# Patient Record
Sex: Male | Born: 1955 | Race: Black or African American | Hispanic: No | Marital: Single | Smoking: Never smoker
Health system: Southern US, Community
[De-identification: ages and names within clinical notes are randomized; demographics above are authoritative.]

## PROBLEM LIST (undated history)

## (undated) DIAGNOSIS — R040 Epistaxis: Secondary | ICD-10-CM

## (undated) DIAGNOSIS — I2510 Atherosclerotic heart disease of native coronary artery without angina pectoris: Secondary | ICD-10-CM

## (undated) DIAGNOSIS — G20A1 Parkinson's disease without dyskinesia, without mention of fluctuations: Secondary | ICD-10-CM

## (undated) DIAGNOSIS — I10 Essential (primary) hypertension: Secondary | ICD-10-CM

## (undated) DIAGNOSIS — G20 Parkinson's disease: Secondary | ICD-10-CM

---

## 2004-12-10 ENCOUNTER — Emergency Department (HOSPITAL_COMMUNITY): Admission: EM | Admit: 2004-12-10 | Discharge: 2004-12-10 | Payer: Self-pay | Admitting: Emergency Medicine

## 2013-09-14 ENCOUNTER — Encounter (HOSPITAL_COMMUNITY): Payer: Self-pay | Admitting: Emergency Medicine

## 2013-09-14 ENCOUNTER — Emergency Department (HOSPITAL_COMMUNITY)
Admission: EM | Admit: 2013-09-14 | Discharge: 2013-09-15 | Disposition: A | Discharge status: Home / Self Care | Payer: Self-pay | Attending: Emergency Medicine | Admitting: Emergency Medicine

## 2013-09-14 DIAGNOSIS — I10 Essential (primary) hypertension: Secondary | ICD-10-CM | POA: Insufficient documentation

## 2013-09-14 DIAGNOSIS — R040 Epistaxis: Secondary | ICD-10-CM | POA: Insufficient documentation

## 2013-09-14 HISTORY — DX: Essential (primary) hypertension: I10

## 2013-09-14 MED ORDER — OXYMETAZOLINE HCL 0.05 % NA SOLN
1.0000 | Freq: Once | NASAL | Status: AC
  Administered 2013-09-14: 1 via NASAL
  Filled 2013-09-14: qty 15

## 2013-09-14 NOTE — ED Notes (Signed)
Patient presents with nose bleed for the last several hours  States he has HTN but does not take BP meds

## 2013-09-14 NOTE — ED Notes (Signed)
Discharge instructions given  Voiced understanding.   

## 2013-09-14 NOTE — ED Provider Notes (Signed)
CSN: [id number]     Arrival date & time 09/14/13  1855 History   First MD Initiated Contact with Patient 09/14/13 1903     Chief Complaint  Patient presents with  . Epistaxis     (Consider location/radiation/quality/duration/timing/severity/associated sxs/prior Treatment) HPI  This is a 58 y.o. male, with a past medical history of hypertension, who has not seen a doctor in "over 20 years," with a history of frequent epistaxis, presenting today with epistaxis. Onset 4 hours ago. Bleeding is from the left naris. It is persistent. It is plentiful. Patient has applied a pressure to no avail. No aggravating factors. Negative for pain or trauma. Negative for family history of bleeding disorders. Patient denies bleeding from any other places abnormally.  Past Medical History  Diagnosis Date  . Hypertension    History reviewed. No pertinent past surgical history. History reviewed. No pertinent family history. History  Substance Use Topics  . Smoking status: Never Smoker   . Smokeless tobacco: Never Used  . Alcohol Use: No    Review of Systems  Constitutional: Negative for fever and chills.  HENT: Positive for nosebleeds. Negative for facial swelling.   Eyes: Negative for pain and visual disturbance.  Respiratory: Negative for chest tightness and shortness of breath.   Cardiovascular: Negative for chest pain.  Gastrointestinal: Negative for nausea and vomiting.  Genitourinary: Negative for dysuria.  Musculoskeletal: Negative for arthralgias and myalgias.  Neurological: Negative for headaches.  Psychiatric/Behavioral: Negative for behavioral problems.      Allergies  Review of patient's allergies indicates no known allergies.  Home Medications   Prior to Admission medications   Not on File   BP 163/100  Pulse 91  Temp(Src) 98.4 F (36.9 C) (Oral)  Resp 16  Ht 5\' 8"  (1.727 m)  Wt 243 lb (110.224 kg)  BMI 36.96 kg/m2  SpO2 99% Physical Exam  Constitutional: He is  oriented to person, place, and time. He appears well-developed and well-nourished. No distress.  HENT:  Head: Normocephalic and atraumatic.  Nose: Epistaxis (left-sided) is observed.  Mouth/Throat: No oropharyngeal exudate.  Eyes: Conjunctivae are normal. Pupils are equal, round, and reactive to light. No scleral icterus.  Neck: Normal range of motion. No tracheal deviation present. No thyromegaly present.  Cardiovascular: Normal rate, regular rhythm and normal heart sounds.  Exam reveals no gallop and no friction rub.   No murmur heard. Pulmonary/Chest: Effort normal and breath sounds normal. No stridor. No respiratory distress. He has no wheezes. He has no rales. He exhibits no tenderness.  Abdominal: Soft. He exhibits no distension and no mass. There is no tenderness. There is no rebound and no guarding.  Musculoskeletal: Normal range of motion. He exhibits no edema.  Neurological: He is alert and oriented to person, place, and time.  Skin: Skin is warm and dry. He is not diaphoretic.    ED Course  Procedures (including critical care time)  MDM   Final diagnoses:  Epistaxis    This is a 58 y.o. male, with a past medical history of hypertension, who has not seen a doctor in "over 20 years," with a history of frequent epistaxis, presenting today with epistaxis. Onset 4 hours ago. Bleeding is from the left naris. It is persistent. It is plentiful. Patient has applied a pressure to no avail. No aggravating factors. Negative for pain or trauma. Negative for family history of bleeding disorders. Patient denies bleeding from any other places abnormally.  I was unable to localize exact source of bleed,  although I am confident it is from the left naris. Appears anterior in location.  Patient has no signs or symptoms of anemia. I've had patient blow his nose, I have irrigated the left nose thoroughly, evacuated all clots, applied liberal amount of Afrin, and placed a 5.5 rapid Rhino into the left  naris. The patient's nose is currently hemostatic. We'll continue to monitor closely. Patient remains stable at this time.  Patient remained stable and hemostatic at this time. I am discharging the patient with rapid Rhino in place, with instructions to followup with ENT office in the next 3-5 days.  Unfortunately, patient has begun bleeding once more while walking out of the department. I reevaluated the patient. The bleeding is a very slow drip from the left naris. I have discussed the various options with the patient, including risks and benefits. These options include inflating the rapid Rhino by very small amount, watchful waiting, removing the rapid rhino and replacing with a larger rapid Rhino.  Patient is very clear that he does not desire the removal of this rapid rhino with replacement of a larger one.  With the patient's permission, I have added 2 cc of air to the rapid Rhino. His nose is once again hemostatic.  He is requesting discharge. Pt stable for discharge, FU.  All questions answered.  Return precautions given.  I have discussed case and care has been guided by my attending physician, Dr. Fonnie JarvisBednar.   Loma BostonStirling Jaimere Feutz, MD 09/14/13 [phone]

## 2013-09-14 NOTE — ED Notes (Signed)
Called for triage no answer  

## 2013-09-14 NOTE — Discharge Instructions (Signed)

## 2013-09-14 NOTE — ED Notes (Signed)
Dr Clearance CootsHarper in to inflate rapid rhino.  Very little bleeding noted from left nostril.  Patient moved to hall in chair to receive another patient.

## 2013-09-14 NOTE — ED Notes (Signed)
Patient was leaving the ED and at discharge and stated that his nose stated bleeding again.  Patient returned to Room 34

## 2013-09-15 NOTE — ED Notes (Signed)
Patient reviewed discharge instructions, voiced understanding.

## 2013-09-19 NOTE — ED Provider Notes (Signed)
I saw and evaluated the patient, reviewed the resident's note and I agree with the findings and plan.   EKG Interpretation None     Bleeding controlled with RapidRhino.  Hurman HornJohn M Broxton Broady, MD 09/19/13 2136

## 2013-09-26 ENCOUNTER — Observation Stay (HOSPITAL_COMMUNITY): Payer: Self-pay

## 2013-09-26 ENCOUNTER — Observation Stay (HOSPITAL_COMMUNITY)
Admission: EM | Admit: 2013-09-26 | Discharge: 2013-09-27 | Disposition: A | Discharge status: Home / Self Care | Payer: Self-pay | Attending: Internal Medicine | Admitting: Internal Medicine

## 2013-09-26 ENCOUNTER — Encounter (HOSPITAL_COMMUNITY): Payer: Self-pay | Admitting: Emergency Medicine

## 2013-09-26 DIAGNOSIS — I10 Essential (primary) hypertension: Secondary | ICD-10-CM | POA: Insufficient documentation

## 2013-09-26 DIAGNOSIS — R040 Epistaxis: Principal | ICD-10-CM | POA: Diagnosis present

## 2013-09-26 DIAGNOSIS — R9431 Abnormal electrocardiogram [ECG] [EKG]: Secondary | ICD-10-CM | POA: Insufficient documentation

## 2013-09-26 DIAGNOSIS — E876 Hypokalemia: Secondary | ICD-10-CM | POA: Insufficient documentation

## 2013-09-26 DIAGNOSIS — R55 Syncope and collapse: Secondary | ICD-10-CM

## 2013-09-26 LAB — COMPREHENSIVE METABOLIC PANEL
ALT: 17 U/L (ref 0–53)
AST: 23 U/L (ref 0–37)
Albumin: 3.5 g/dL (ref 3.5–5.2)
Alkaline Phosphatase: 75 U/L (ref 39–117)
BUN: 15 mg/dL (ref 6–23)
CHLORIDE: 102 meq/L (ref 96–112)
CO2: 23 meq/L (ref 19–32)
Calcium: 8.5 mg/dL (ref 8.4–10.5)
Creatinine, Ser: 1.33 mg/dL (ref 0.50–1.35)
GFR calc Af Amer: 68 mL/min — ABNORMAL LOW (ref >90)
GFR, EST NON AFRICAN AMERICAN: 58 mL/min — AB (ref >90)
GLUCOSE: 173 mg/dL — AB (ref 70–99)
Potassium: 3.4 mEq/L — ABNORMAL LOW (ref 3.7–5.3)
SODIUM: 142 meq/L (ref 137–147)
TOTAL PROTEIN: 7.3 g/dL (ref 6.0–8.3)

## 2013-09-26 LAB — CBG MONITORING, ED: Glucose-Capillary: 134 mg/dL — ABNORMAL HIGH (ref 70–99)

## 2013-09-26 LAB — CBC WITH DIFFERENTIAL/PLATELET
BASOS ABS: 0 10*3/uL (ref 0.0–0.1)
BASOS PCT: 1 % (ref 0–1)
Eosinophils Absolute: 0.2 10*3/uL (ref 0.0–0.7)
Eosinophils Relative: 3 % (ref 0–5)
HCT: 33.3 % — ABNORMAL LOW (ref 39.0–52.0)
Hemoglobin: 11.3 g/dL — ABNORMAL LOW (ref 13.0–17.0)
LYMPHS ABS: 3.8 10*3/uL (ref 0.7–4.0)
LYMPHS PCT: 47 % — AB (ref 12–46)
MCH: 30.1 pg (ref 26.0–34.0)
MCHC: 33.9 g/dL (ref 30.0–36.0)
MCV: 88.8 fL (ref 78.0–100.0)
MONOS PCT: 8 % (ref 3–12)
Monocytes Absolute: 0.7 10*3/uL (ref 0.1–1.0)
NEUTROS PCT: 41 % — AB (ref 43–77)
Neutro Abs: 3.3 10*3/uL (ref 1.7–7.7)
PLATELETS: 302 10*3/uL (ref 150–400)
RBC: 3.75 MIL/uL — AB (ref 4.22–5.81)
RDW: 13.7 % (ref 11.5–15.5)
WBC: 8 10*3/uL (ref 4.0–10.5)

## 2013-09-26 LAB — PROTIME-INR
INR: 0.93 (ref 0.00–1.49)
PROTHROMBIN TIME: 12.3 s (ref 11.6–15.2)

## 2013-09-26 LAB — TROPONIN I
Troponin I: <0.3 ng/mL (ref <0.30)
Troponin I: <0.3 ng/mL (ref <0.30)
Troponin I: <0.3 ng/mL (ref <0.30)

## 2013-09-26 LAB — LIPID PANEL
CHOLESTEROL: 129 mg/dL (ref 0–200)
HDL: 47 mg/dL (ref >39)
LDL CALC: 73 mg/dL (ref 0–99)
TRIGLYCERIDES: 44 mg/dL (ref <150)
Total CHOL/HDL Ratio: 2.7 RATIO
VLDL: 9 mg/dL (ref 0–40)

## 2013-09-26 LAB — HEMOGLOBIN AND HEMATOCRIT, BLOOD
HCT: 31.4 % — ABNORMAL LOW (ref 39.0–52.0)
Hemoglobin: 10.6 g/dL — ABNORMAL LOW (ref 13.0–17.0)

## 2013-09-26 LAB — SAMPLE TO BLOOD BANK

## 2013-09-26 LAB — HEMOGLOBIN A1C
HEMOGLOBIN A1C: 5.7 % — AB (ref <5.7)
Mean Plasma Glucose: 117 mg/dL — ABNORMAL HIGH (ref <117)

## 2013-09-26 MED ORDER — CEPHALEXIN 250 MG/5ML PO SUSR: 250.0000 mg | Freq: Three times a day (TID) | ORAL | Status: DC

## 2013-09-26 MED ORDER — CEPHALEXIN 250 MG PO CAPS
250.0000 mg | ORAL_CAPSULE | Freq: Three times a day (TID) | ORAL | Status: DC
  Administered 2013-09-26 – 2013-09-27 (×4): 250 mg via ORAL
  Filled 2013-09-26 (×7): qty 1

## 2013-09-26 MED ORDER — HYDROCODONE-ACETAMINOPHEN 5-325 MG PO TABS
1.0000 | ORAL_TABLET | ORAL | Status: DC | PRN
  Administered 2013-09-26: 2 via ORAL
  Filled 2013-09-26: qty 2

## 2013-09-26 MED ORDER — ONDANSETRON HCL 4 MG/2ML IJ SOLN: 4.0000 mg | Freq: Four times a day (QID) | INTRAMUSCULAR | Status: DC | PRN

## 2013-09-26 MED ORDER — ONDANSETRON HCL 4 MG/2ML IJ SOLN
4.0000 mg | Freq: Once | INTRAMUSCULAR | Status: AC
  Administered 2013-09-26: 4 mg via INTRAVENOUS
  Filled 2013-09-26: qty 2

## 2013-09-26 MED ORDER — SODIUM CHLORIDE 0.9 % IV BOLUS (SEPSIS)
1000.0000 mL | Freq: Once | INTRAVENOUS | Status: AC
  Administered 2013-09-26: 1000 mL via INTRAVENOUS

## 2013-09-26 MED ORDER — ACETAMINOPHEN 325 MG PO TABS
650.0000 mg | ORAL_TABLET | Freq: Four times a day (QID) | ORAL | Status: DC | PRN
  Filled 2013-09-26: qty 2

## 2013-09-26 MED ORDER — SODIUM CHLORIDE 0.9 % IV SOLN
INTRAVENOUS | Status: DC
  Administered 2013-09-26: 23:00:00 via INTRAVENOUS

## 2013-09-26 MED ORDER — HYDRALAZINE HCL 20 MG/ML IJ SOLN
10.0000 mg | Freq: Four times a day (QID) | INTRAMUSCULAR | Status: DC | PRN
  Administered 2013-09-26 – 2013-09-27 (×2): 10 mg via INTRAVENOUS
  Filled 2013-09-26 (×2): qty 1

## 2013-09-26 MED ORDER — SODIUM CHLORIDE 0.9 % IV SOLN
INTRAVENOUS | Status: AC
  Administered 2013-09-26: 12:00:00 via INTRAVENOUS

## 2013-09-26 MED ORDER — DEXTROSE 50 % IV SOLN
INTRAVENOUS | Status: AC
  Filled 2013-09-26: qty 50

## 2013-09-26 MED ORDER — ONDANSETRON HCL 4 MG PO TABS: 4.0000 mg | ORAL_TABLET | Freq: Four times a day (QID) | ORAL | Status: DC | PRN

## 2013-09-26 MED ORDER — POTASSIUM CHLORIDE CRYS ER 20 MEQ PO TBCR
40.0000 meq | EXTENDED_RELEASE_TABLET | Freq: Once | ORAL | Status: AC
  Administered 2013-09-26: 40 meq via ORAL
  Filled 2013-09-26: qty 2

## 2013-09-26 MED ORDER — ACETAMINOPHEN 650 MG RE SUPP: 650.0000 mg | Freq: Four times a day (QID) | RECTAL | Status: DC | PRN

## 2013-09-26 NOTE — H&P (Signed)
[facility] History and Physical  Jason DeutscherWilliam Minasyan ZOX:[id number] DOB: [birth year] DOA: 09/26/2013  Referring physician: Dr Manus Gunningancour.  PCP: No PCP Per Patient   Chief Complaint: nose bleeding.   HPI: Jason Roberson is a 58 y.o. male with no significant PMH who presents with nose bleed that started today around 6 Am. He had history of similar presentation on April 18 that was controlled a [employer] R[employer]. He did not followup with ENT. He said the packing fell out 3 days after he was in the ED. He relates profuse nose bleeding that started this am. Patient vomited twice bright red blood.   While patient was been cauterized with silver nitrate,  patient became extremely diaphoretic, minimally responsive. Mental status improved with observation in the ED. No hypotension. EKG shows ST elevation in aVR with diffuse ST depression in inferior lateral leads. Dr Manus Gunningancour discussed with Dr. Excell Seltzerooper cardiology. He feels is not meet STEMI criteria. Recommends enzymes and repeat EKG.  Subsequent EKGs showed improving elevation in aVR. Evidence of LVH. First troponin negative. Patient denies any chest pain or shortness of breath. [facility]  was consulted for admission.     Review of Systems:  Negative except as per HPI.    Past Medical History  Diagnosis Date  . Hypertension    History reviewed. No pertinent past surgical history. Social History:  reports that he has never smoked. He has never used smokeless tobacco. He reports that he does not drink alcohol or use illicit drugs.  No Known Allergies  Family History; father with history of HTN.   Prior to Admission medications   Not on File  No medications prior to admission other than baby aspirin.   Physical Exam: Filed Vitals:   09/26/13 1235  BP: 145/90  Pulse:   Temp:   Resp: 17    BP 145/90  Pulse 95  Temp(Src) 98.8 F (37.1 C) (Oral)  Resp 17  Wt 110.224 kg (243 lb)  SpO2 99%  General:  Appears calm and comfortable Eyes: PERRL,  normal lids, irises & conjunctiva ENT: grossly normal hearing, lips & tongue, rhino -rocket in place.  Neck: no LAD, masses or thyromegaly Cardiovascular: RRR, no m/r/g. No LE edema. Telemetry: SR, no arrhythmias  Respiratory: CTA bilaterally, no w/r/r. Normal respiratory effort. Abdomen: soft, ntnd Skin: no rash or induration seen on limited exam Musculoskeletal: grossly normal tone BUE/BLE Psychiatric: grossly normal mood and affect, speech fluent and appropriate Neurologic: grossly non-focal.          Labs on Admission:  Basic Metabolic Panel:  Recent Labs Lab 09/26/13 1001  NA 142  K 3.4*  CL 102  CO2 23  GLUCOSE 173*  BUN 15  CREATININE 1.33  CALCIUM 8.5   Liver Function Tests:  Recent Labs Lab 09/26/13 1001  AST 23  ALT 17  ALKPHOS 75  BILITOT <0.2*  PROT 7.3  ALBUMIN 3.5   No results found for this basename: LIPASE, AMYLASE,  in the last 168 hours No results found for this basename: AMMONIA,  in the last 168 hours CBC:  Recent Labs Lab 09/26/13 1001  WBC 8.0  NEUTROABS 3.3  HGB 11.3*  HCT 33.3*  MCV 88.8  PLT 302   Cardiac Enzymes:  Recent Labs Lab 09/26/13 1001  TROPONINI <0.30    BNP (last 3 results) No results found for this basename: PROBNP,  in the last 8760 hours CBG:  Recent Labs Lab 09/26/13 0954  GLUCAP 134*    Radiological Exams on Admission:  Dg Chest Portable 1 View  09/26/2013   CLINICAL DATA:  Severe epistaxis  EXAM: PORTABLE CHEST - 1 VIEW  COMPARISON:  None.  FINDINGS: The heart size and mediastinal contours are within normal limits. Both lungs are clear. The visualized skeletal structures are unremarkable.  IMPRESSION: No active disease.   Electronically Signed   By: Elige KoHetal  Patel   On: 09/26/2013 12:54    EKG: Independently reviewed. First EKG with ST elevation on AVR subsequently EKG with no significant elevation on AVR.   Assessment/Plan Active Problems:   Epistaxis   Syncope   1-Epistaxis; patient s/p  cauterization  with silver nitrate and Rhino Rocket was placed. Bleeding stop. Will start prophylactic antibiotics. Needs to follow up with ENT.   2-Syncope; probably vaso- vagal. Cycle cardiac enzymes, monitor on telemetry. Repeat Hb. Check ECHO.   3-Abnormal EKG; cycle troponin, check ECHO. Patient denies chest pain or dyspnea. Will risk stratified.   4-Hypokalemia; replete with 40 meq times one.   Code Status: Full Code.  Family Communication: care discussed with patient.  Disposition Plan: expect less than 2 days admission.   Time spent: 75 minutes.   Belkys A Regalado [facility] Pager [phone]

## 2013-09-26 NOTE — ED Notes (Signed)
Pt remains diaphoretic, alert, aware of what is going on, following directions. Dr. Manus Gunningancour at bedside

## 2013-09-26 NOTE — ED Notes (Signed)
Lunch tray given. 

## 2013-09-26 NOTE — Discharge Planning (Signed)
[facility] Community Liaison  Spoke to patient about follow up care and primary care resources. Patient states he has not seen a provider in over 30 years. I provided the patient with the orange card application and instructions, patient is being admitted so a follow up appointment was not made. Patient was instructed to contact me if he was not set up with a provider upon discharge. Resource guide and my contact information was provided.

## 2013-09-26 NOTE — ED Notes (Signed)
Report given to [id number] RN  

## 2013-09-26 NOTE — ED Notes (Signed)
To ed via private vehicle with epistaxis- from left nares, started bleeding at 5 am, hx of same. Bleeding bright red blood, large amount-- alert/oriented x 3, w/d

## 2013-09-26 NOTE — Progress Notes (Signed)
09/26/13 Patient's BP 186/111,pt denies any c/o.NP T. Claiborne BillingsCallahan notified,new orders received.Will continue to monitor. Valerya Maxton Heide ScalesJoselita Isaish Alemu, RN

## 2013-09-26 NOTE — ED Provider Notes (Signed)
CSN: [id number]     Arrival date & time 09/26/13  [id number] History   First MD Initiated Contact with Patient 09/26/13 [phone]     Chief Complaint  Patient presents with  . Epistaxis     (Consider location/radiation/quality/duration/timing/severity/associated sxs/prior Treatment) HPI Comments: Patient complains of ongoing left-sided nosebleed since 6 AM. History of similar presentation on April 18 that was controlled a [employer] R[employer]. Patient is not on any blood thinners. He tried pressure without relief. He did not followup with ENT. He said the packing fell out 3 days after he was in the ED. It occurred again this morning. No history of bleeding disorders. No chest pain, shortness of breath or abdominal pain.  The history is provided by the patient.    Past Medical History  Diagnosis Date  . Hypertension    History reviewed. No pertinent past surgical history. History reviewed. No pertinent family history. History  Substance Use Topics  . Smoking status: Never Smoker   . Smokeless tobacco: Never Used  . Alcohol Use: No    Review of Systems  Unable to perform ROS: Acuity of condition  Constitutional: Positive for appetite change. Negative for activity change.      Allergies  Review of patient's allergies indicates no known allergies.  Home Medications   Prior to Admission medications   Not on File   BP 132/81  Pulse 95  Temp(Src) 98.8 F (37.1 C) (Oral)  Resp 20  Wt 243 lb (110.224 kg)  SpO2 93% Physical Exam  Constitutional: He is oriented to person, place, and time. He appears well-developed and well-nourished. He appears distressed.  Patient distress with 2 emesis basins full of blood.  HENT:  Head: Normocephalic and atraumatic.  Mouth/Throat: Oropharynx is clear and moist. No oropharyngeal exudate.  Brisk arterial bleeding from the left nasal septum Oropharynx is clear  Eyes: Conjunctivae and EOM are normal. Pupils are equal, round, and reactive to light.  Neck:  Normal range of motion. Neck supple.  Cardiovascular: Normal rate, regular rhythm and normal heart sounds.   Pulmonary/Chest: Effort normal and breath sounds normal. No respiratory distress.  Abdominal: Soft. There is no tenderness. There is no rebound.  Musculoskeletal: Normal range of motion. He exhibits no edema and no tenderness.  Neurological: He is alert and oriented to person, place, and time. No cranial nerve deficit. He exhibits normal muscle tone. Coordination normal.  Skin: Skin is warm. He is diaphoretic.    ED Course  EPISTAXIS MANAGEMENT Date/Time: 09/26/2013 12:21 PM Performed by: Glynn OctaveANCOUR, Eloise Mula Authorized by: Glynn OctaveANCOUR, Tahiry Spicer Consent: Verbal consent obtained. The procedure was performed in an emergent situation. Risks and benefits: risks, benefits and alternatives were discussed Consent given by: patient Patient understanding: patient states understanding of the procedure being performed Patient consent: the patient's understanding of the procedure matches consent given Procedure consent: procedure consent matches procedure scheduled Patient identity confirmed: verbally with patient and provided demographic data Time out: Immediately prior to procedure a "time out" was called to verify the correct patient, procedure, equipment, support staff and site/side marked as required. Anesthesia: local infiltration Local anesthetic: topical anesthetic Patient sedated: no Treatment site: left anterior Repair method: silver nitrate, anterior pack, cophenylcaine and suction Post-procedure assessment: bleeding stopped Treatment complexity: complex Recurrence: recurrence of recent bleed Patient tolerance: Patient tolerated the procedure well with no immediate complications.   (including critical care time) Labs Review Labs Reviewed  CBC WITH DIFFERENTIAL - Abnormal; Notable for the following:    RBC 3.75 (*)  Hemoglobin 11.3 (*)    HCT 33.3 (*)    Neutrophils Relative % 41  (*)    Lymphocytes Relative 47 (*)    All other components within normal limits  COMPREHENSIVE METABOLIC PANEL - Abnormal; Notable for the following:    Potassium 3.4 (*)    Glucose, Bld 173 (*)    Total Bilirubin <0.2 (*)    GFR calc non Af Amer 58 (*)    GFR calc Af Amer 68 (*)    All other components within normal limits  CBG MONITORING, ED - Abnormal; Notable for the following:    Glucose-Capillary 134 (*)    All other components within normal limits  PROTIME-INR  TROPONIN I  SAMPLE TO BLOOD BANK    Imaging Review No results found.   EKG Interpretation   Date/Time:  Thursday September 26 2013 10:24:02 EDT Ventricular Rate:  90 PR Interval:  161 QRS Duration: 109 QT Interval:  389 QTC Calculation: 476 R Axis:   -23 Text Interpretation:  Sinus rhythm Multiple ventricular premature  complexes Abnormal R-wave progression, early transition Left ventricular  hypertrophy Borderline prolonged QT interval Premature ventricular  complexes Confirmed by Manus GunningANCOUR  MD, Cicily Bonano ([id number]) on 09/26/2013 11:17:12  AM      MDM   Final diagnoses:  Epistaxis  Syncope  EKG abnormality   Left-sided anterior arterial nosebleed. No anticoagulant use. No chest pain or shortness of breath.  Patient was active arterial bleeding from left nasal septum. This was cauterized with silver nitrate and Rhino Rocket was placed.  During epistaxis management, patient became extremely diaphoretic, minimally responsive. Suspect vasovagal reaction. Blood sugar 134. Heart rate 90s. Blood pressure 105 systolic. Patient given IV fluids, IV Zofran as he has been vomiting EKG obtained.  Mental status improved with observation in the ED. No hypotension. EKG shows ST elevation in aVR with diffuse ST depression in inferior lateral leads. Discussed with Dr. Excell Seltzerooper cardiology. He feels is not meet STEMI criteria. Recommends enzymes and repeat EKG.  Subsequent EKGs showed improving elevation in aVR. Evidence of LVH.  First troponin negative. Patient denies any chest pain or shortness of breath.  No further bleeding from the nose in the ED. No blood in oropharynx. He is awake and alert and denies any chest pain or SOB.  Patient will be admitted for observation given his syncopal episode as well as abnormal EKG.  CRITICAL CARE Performed by: Glynn OctaveStephen Donshay Lupinski Total critical care time: 45 Critical care time was exclusive of separately billable procedures and treating other patients. Critical care was necessary to treat or prevent imminent or life-threatening deterioration. Critical care was time spent personally by me on the following activities: development of treatment plan with patient and/or surrogate as well as nursing, discussions with consultants, evaluation of patient's response to treatment, examination of patient, obtaining history from patient or surrogate, ordering and performing treatments and interventions, ordering and review of laboratory studies, ordering and review of radiographic studies, pulse oximetry and re-evaluation of patient's condition.   Glynn OctaveStephen Barbara Ahart, MD 09/26/13 1736

## 2013-09-26 NOTE — ED Notes (Signed)
While dr Manus Gunningrancour was cauterizing left nares- patient became very diaphopretic, stating he was going to pass out. Pt became semi responsive to dr. Manus Gunningrancour verbal stimuli. Monitor applied, IV started in right AC, NS running, phlebotomy notified.

## 2013-09-27 DIAGNOSIS — I517 Cardiomegaly: Secondary | ICD-10-CM

## 2013-09-27 LAB — TROPONIN I: Troponin I: <0.3 ng/mL (ref <0.30)

## 2013-09-27 LAB — CBC
HEMATOCRIT: 32.7 % — AB (ref 39.0–52.0)
Hemoglobin: 10.9 g/dL — ABNORMAL LOW (ref 13.0–17.0)
MCH: 29.6 pg (ref 26.0–34.0)
MCHC: 33.3 g/dL (ref 30.0–36.0)
MCV: 88.9 fL (ref 78.0–100.0)
Platelets: 277 10*3/uL (ref 150–400)
RBC: 3.68 MIL/uL — ABNORMAL LOW (ref 4.22–5.81)
RDW: 14 % (ref 11.5–15.5)
WBC: 11.4 10*3/uL — ABNORMAL HIGH (ref 4.0–10.5)

## 2013-09-27 LAB — BASIC METABOLIC PANEL
BUN: 9 mg/dL (ref 6–23)
CALCIUM: 8.6 mg/dL (ref 8.4–10.5)
CO2: 25 mEq/L (ref 19–32)
CREATININE: 0.93 mg/dL (ref 0.50–1.35)
Chloride: 103 mEq/L (ref 96–112)
GFR calc Af Amer: >90 mL/min (ref >90)
GFR calc non Af Amer: >90 mL/min (ref >90)
Glucose, Bld: 111 mg/dL — ABNORMAL HIGH (ref 70–99)
Potassium: 3.2 mEq/L — ABNORMAL LOW (ref 3.7–5.3)
Sodium: 142 mEq/L (ref 137–147)

## 2013-09-27 MED ORDER — CEPHALEXIN 250 MG PO CAPS: 250.0000 mg | ORAL_CAPSULE | Freq: Three times a day (TID) | ORAL | Status: DC

## 2013-09-27 MED ORDER — METOPROLOL TARTRATE 25 MG PO TABS: 25.0000 mg | ORAL_TABLET | Freq: Two times a day (BID) | ORAL | Status: DC

## 2013-09-27 MED ORDER — AMLODIPINE BESYLATE 5 MG PO TABS
5.0000 mg | ORAL_TABLET | Freq: Every day | ORAL | Status: DC
  Administered 2013-09-27: 5 mg via ORAL
  Filled 2013-09-27: qty 1

## 2013-09-27 MED ORDER — AMLODIPINE BESYLATE 5 MG PO TABS: 5.0000 mg | ORAL_TABLET | Freq: Every day | ORAL | Status: DC

## 2013-09-27 MED ORDER — ACETAMINOPHEN 325 MG PO TABS: 650.0000 mg | ORAL_TABLET | Freq: Four times a day (QID) | ORAL | Status: DC | PRN

## 2013-09-27 MED ORDER — METOPROLOL TARTRATE 25 MG PO TABS
25.0000 mg | ORAL_TABLET | Freq: Two times a day (BID) | ORAL | Status: DC
  Administered 2013-09-27: 25 mg via ORAL
  Filled 2013-09-27 (×2): qty 1

## 2013-09-27 NOTE — Progress Notes (Signed)
Patient was discharged home. Patient was discharged home with instructions, education on nose bleeds and packing, and [facility]. Patient was told to leave the packing in for 2-3 days and to return to PCP or ED to have it removed. Patient was discharged with belongings. Patient was stable upon discharge.

## 2013-09-27 NOTE — Progress Notes (Signed)
  Echocardiogram 2D Echocardiogram has been performed.  Jason MessickLauren A Roberson 09/27/2013, 12:32 PM

## 2013-09-27 NOTE — Progress Notes (Signed)
UR Completed.  Larenz Frasier Jane Aprea [phone] 09/27/2013  

## 2013-09-27 NOTE — Discharge Summary (Signed)
Physician Discharge Summary  Gwendlyn DeutscherWilliam Lysne ZOX:[id number] DOB: 03-Oct-1955 DOA: 09/26/2013  PCP: No PCP Per Patient  Admit date: 09/26/2013 Discharge date: 09/27/2013  Time spent: >35 minutes  Recommendations for Outpatient Follow-up:  F/u with PCP in 1 week  Discharge Diagnoses:  Active Problems:   Epistaxis   Syncope   Discharge Condition: stable   Diet recommendation: heart healthy   Filed Weights   09/26/13 1013 09/26/13 1526  Weight: 110.224 kg (243 lb) 134.718 kg (297 lb)    History of present illness:  58 y.o. male with no significant PMH who presents with nose bleed that started today around 6 Am. He had history of similar presentation on April 18 that was controlled a [employer]. He did not followup with ENT. He said the packing fell out 3 days after he was in the ED. He relates profuse nose bleeding    Hospital Course:  Active Problems:  Epistaxis    1. Epistaxis; patient s/p cauterization with silver nitrate and Rhino Rocket was placed. Bleeding stopped. recommended prophylactic antibiotics. And follow up with ENT in 1 week; remove Rhino Rocket 2 days  -no new bleeding during hospitalization, H/H is stable  3. Episode of diaphoresis, ? Presyncope; abnormal EKG; Dr Manus Gunningancour discussed with Dr. Excell Seltzerooper cardiology -cycle troponin, repeat ECG is unremarkable; patient denies any exertional symptoms; no new symptoms;  -pend ECHO; patient is recommended to f/u echo results with PCP in 1 week, provided all necessary information;  4. Hypokalemia; replaced, recheck in 1 week  5. HTN new; started on BB, amlodipine; recommended to cont OP monitor titrate    Procedures:  none (i.e. Studies not automatically included, echos, thoracentesis, etc; not x-rays)  Consultations:  noen  Discharge Exam: Filed Vitals:   09/27/13 1400  BP: 185/100  Pulse: 91  Temp: 98.6 F (37 C)  Resp: 28    General: alert Cardiovascular: s1,s2 rrr Respiratory: CTA BL  Discharge  Instructions  Discharge Orders   Future Orders Complete By Expires   Diet - low sodium heart healthy  As directed    Discharge instructions  As directed    Increase activity slowly  As directed        Medication List         acetaminophen 325 MG tablet  Commonly known as:  TYLENOL  Take 2 tablets (650 mg total) by mouth every 6 (six) hours as needed for mild pain (or Fever >/= 101).     amLODipine 5 MG tablet  Commonly known as:  NORVASC  Take 1 tablet (5 mg total) by mouth daily.     cephALEXin 250 MG capsule  Commonly known as:  KEFLEX  Take 1 capsule (250 mg total) by mouth every 8 (eight) hours.     metoprolol tartrate 25 MG tablet  Commonly known as:  LOPRESSOR  Take 1 tablet (25 mg total) by mouth 2 (two) times daily.       No Known Allergies     Follow-up Information   Follow up with [facility]    . Schedule an appointment as soon as possible for a visit in 1 week.   Contact information:   [address] [place] [id number] [phone]       The results of significant diagnostics from this hospitalization (including imaging, microbiology, ancillary and laboratory) are listed below for reference.    Significant Diagnostic Studies: Dg Chest Portable 1 View  09/26/2013   CLINICAL DATA:  Severe  epistaxis  EXAM: PORTABLE CHEST - 1 VIEW  COMPARISON:  None.  FINDINGS: The heart size and mediastinal contours are within normal limits. Both lungs are clear. The visualized skeletal structures are unremarkable.  IMPRESSION: No active disease.   Electronically Signed   By: Elige KoHetal  Patel   On: 09/26/2013 12:54    Microbiology: No results found for this or any previous visit (from the past 240 hour(s)).   Labs: Basic Metabolic Panel:  Recent Labs Lab 09/26/13 1001 09/27/13 0401  NA 142 142  K 3.4* 3.2*  CL 102 103  CO2 23 25  GLUCOSE 173* 111*  BUN 15 9  CREATININE 1.33 0.93  CALCIUM 8.5 8.6   Liver Function  Tests:  Recent Labs Lab 09/26/13 1001  AST 23  ALT 17  ALKPHOS 75  BILITOT <0.2*  PROT 7.3  ALBUMIN 3.5   No results found for this basename: LIPASE, AMYLASE,  in the last 168 hours No results found for this basename: AMMONIA,  in the last 168 hours CBC:  Recent Labs Lab 09/26/13 1001 09/26/13 1436 09/27/13 0401  WBC 8.0  --  11.4*  NEUTROABS 3.3  --   --   HGB 11.3* 10.6* 10.9*  HCT 33.3* 31.4* 32.7*  MCV 88.8  --  88.9  PLT 302  --  277   Cardiac Enzymes:  Recent Labs Lab 09/26/13 1001 09/26/13 1436 09/26/13 1938 09/27/13 0402  TROPONINI <0.30 <0.30 <0.30 <0.30   BNP: BNP (last 3 results) No results found for this basename: PROBNP,  in the last 8760 hours CBG:  Recent Labs Lab 09/26/13 0954  GLUCAP 134*       Signed:  Isaiah SergeUlugbek N Maicey Barrientez  [facility] 09/27/2013, 3:24 PM

## 2013-12-27 ENCOUNTER — Emergency Department (HOSPITAL_COMMUNITY)
Admission: EM | Admit: 2013-12-27 | Discharge: 2013-12-27 | Disposition: A | Discharge status: Home / Self Care | Payer: Self-pay | Attending: Emergency Medicine | Admitting: Emergency Medicine

## 2013-12-27 ENCOUNTER — Encounter (HOSPITAL_COMMUNITY): Payer: Self-pay | Admitting: Emergency Medicine

## 2013-12-27 DIAGNOSIS — I10 Essential (primary) hypertension: Secondary | ICD-10-CM | POA: Insufficient documentation

## 2013-12-27 DIAGNOSIS — R040 Epistaxis: Secondary | ICD-10-CM | POA: Insufficient documentation

## 2013-12-27 DIAGNOSIS — Z79899 Other long term (current) drug therapy: Secondary | ICD-10-CM | POA: Insufficient documentation

## 2013-12-27 HISTORY — DX: Epistaxis: R04.0

## 2013-12-27 MED ORDER — SODIUM CHLORIDE 0.9 % IV SOLN: Freq: Once | INTRAVENOUS | Status: DC

## 2013-12-27 MED ORDER — AMLODIPINE BESYLATE 5 MG PO TABS: 5.0000 mg | ORAL_TABLET | Freq: Every day | ORAL | Status: DC

## 2013-12-27 MED ORDER — AMLODIPINE BESYLATE 5 MG PO TABS
10.0000 mg | ORAL_TABLET | Freq: Every day | ORAL | Status: DC
  Administered 2013-12-27: 10 mg via ORAL
  Filled 2013-12-27: qty 2

## 2013-12-27 MED ORDER — METOPROLOL TARTRATE 50 MG PO TABS: 25.0000 mg | ORAL_TABLET | Freq: Two times a day (BID) | ORAL | Status: DC

## 2013-12-27 MED ORDER — OXYMETAZOLINE HCL 0.05 % NA SOLN
1.0000 | Freq: Once | NASAL | Status: AC
  Administered 2013-12-27: 1 via NASAL

## 2013-12-27 MED ORDER — ONDANSETRON HCL 4 MG/2ML IJ SOLN
4.0000 mg | Freq: Once | INTRAMUSCULAR | Status: AC
  Administered 2013-12-27: 4 mg via INTRAVENOUS
  Filled 2013-12-27: qty 2

## 2013-12-27 NOTE — ED Notes (Signed)
Pt has not taken BP meds in 3 weeks- woke up this morning and started having nose bleed. BP for EMS was 190/90, HR 112 sinus tach. Pt denies pain/weakness, Pt vomited 3 times with EMS. Pt is alert and oriented. Airway is stable

## 2013-12-27 NOTE — ED Provider Notes (Signed)
Medical screening examination/treatment/procedure(s) were conducted as a shared visit with non-physician practitioner(s) and myself.  I personally evaluated the patient during the encounter.   EKG Interpretation None      I interviewed and examined the patient. Lungs are CTAB. Cardiac exam wnl. Abdomen soft.  No obvious source of bleeding on my examination of the nares, but bleeding has stopped and pt is well appearing. Plan for d/c home.   Junius ArgyleForrest S Shatana Saxton, MD 12/27/13 [id number]

## 2013-12-27 NOTE — ED Provider Notes (Signed)
CSN: [id number]     Arrival date & time 12/27/13  [id number] History   None    Chief Complaint  Patient presents with  . Epistaxis  . Hypertension     (Consider location/radiation/quality/duration/timing/severity/associated sxs/prior Treatment) HPI  Jason Roberson is a 58 y.o. male complaining of acute onset of left nare epistaxis this a.m. Bleeding resolves in ambulance. Patient had single episode of emesis. States that he felt that his mouth was full of blood. Patient's had several similar episodes in the past one was thought to be arterial and patient was admitted. Patient's been noncompliant with his hypertension medications, cannot give me a reason why he hasn't taken them. Patient denies any chest pain, shortness of breath, palpitations, syncope, headache, change in vision, dysarthria, ataxia, hematuria. Patient does not have a primary care provider.  Past Medical History  Diagnosis Date  . Hypertension   . Nosebleed    History reviewed. No pertinent past surgical history. History reviewed. No pertinent family history. History  Substance Use Topics  . Smoking status: Never Smoker   . Smokeless tobacco: Never Used  . Alcohol Use: No    Review of Systems  10 systems reviewed and found to be negative, except as noted in the HPI.   Allergies  Review of patient's allergies indicates no known allergies.  Home Medications   Prior to Admission medications   Medication Sig Start Date End Date Taking? Authorizing Provider  acetaminophen (TYLENOL) 325 MG tablet Take 2 tablets (650 mg total) by mouth every 6 (six) hours as needed for mild pain (or Fever >/= 101). 09/27/13  Yes Esperanza SheetsUlugbek N Buriev, MD  amLODipine (NORVASC) 5 MG tablet Take 1 tablet (5 mg total) by mouth daily. 09/27/13  Yes Esperanza SheetsUlugbek N Buriev, MD  metoprolol tartrate (LOPRESSOR) 25 MG tablet Take 1 tablet (25 mg total) by mouth 2 (two) times daily. 09/27/13  Yes Esperanza SheetsUlugbek N Buriev, MD  amLODipine (NORVASC) 5 MG tablet Take 1 tablet  (5 mg total) by mouth daily. 12/27/13   Baillie Mohammad, PA-C  metoprolol (LOPRESSOR) 50 MG tablet Take 0.5 tablets (25 mg total) by mouth 2 (two) times daily. 12/27/13   Halei Hanover, PA-C   BP 159/98  Pulse 101  Temp(Src) 99 F (37.2 C) (Oral)  Resp 24  Ht 5\' 8"  (1.727 m)  Wt 295 lb (133.811 kg)  BMI 44.86 kg/m2  SpO2 100% Physical Exam  Nursing note and vitals reviewed. Constitutional: He is oriented to person, place, and time. He appears well-developed and well-nourished. No distress.  Obese  HENT:  Head: Normocephalic.  Mouth/Throat: Oropharynx is clear and moist.  Eyes: Conjunctivae and EOM are normal. Pupils are equal, round, and reactive to light.  Neck: Normal range of motion.  Cardiovascular: Normal rate, regular rhythm and intact distal pulses.   Pulmonary/Chest: Effort normal and breath sounds normal. No stridor. No respiratory distress. He has no wheezes. He has no rales. He exhibits no tenderness.  Abdominal: Soft. Bowel sounds are normal. He exhibits no distension and no mass. There is no tenderness. There is no rebound and no guarding.  Musculoskeletal: Normal range of motion.  Neurological: He is alert and oriented to person, place, and time.  Psychiatric: He has a normal mood and affect.    ED Course  Procedures (including critical care time) Labs Review Labs Reviewed - No data to display  Imaging Review No results found.   EKG Interpretation None      MDM   Final diagnoses:  Epistaxis,  recurrent  Hypertension, uncontrolled    Filed Vitals:   12/27/13 0830 12/27/13 0900 12/27/13 0915 12/27/13 1014  BP: 169/125 152/98 158/99 159/98  Pulse: 108 106 104 101  Temp:   98.8 F (37.1 C) 99 F (37.2 C)  TempSrc:   Oral Oral  Resp:   24 24  Height:      Weight:      SpO2: 100% 100% 100% 100%    Medications  amLODipine (NORVASC) tablet 10 mg (10 mg Oral Given 12/27/13 0906)  oxymetazoline (AFRIN) 0.05 % nasal spray 1 spray (1 spray Each Nare  Given by Other 12/27/13 0903)  ondansetron (ZOFRAN) injection 4 mg (4 mg Intravenous Given 12/27/13 0906)    Jason Roberson is a 58 y.o. male presenting with left naris at this access. Resolved prior to presentation at the ED. Patient has no signs of anemia. Patient is noncompliant with his blood pressure medications. Blood pressure is significantly elevated in the ED. However he is asymptomatic for this as well. We've had extensive discussions about the importance of taking his hypertensive medications. I will write him for a prescription and encouraged him to establish outpatient care. We haven't had an extensive discussion return precautions. Clot was removed from the left nare, patient is observed in the ED and bleeding has not returned.  This is a shared visit with the attending physician who personally evaluated the patient and agrees with the care plan.    Evaluation does not show pathology that would require ongoing emergent intervention or inpatient treatment. Pt is hemodynamically stable and mentating appropriately. Discussed findings and plan with patient/guardian, who agrees with care plan. All questions answered. Return precautions discussed and outpatient follow up given.   Discharge Medication List as of 12/27/2013  9:56 AM    START taking these medications   Details  !! amLODipine (NORVASC) 5 MG tablet Take 1 tablet (5 mg total) by mouth daily., Starting 12/27/2013, Until Discontinued, Print    !! metoprolol (LOPRESSOR) 50 MG tablet Take 0.5 tablets (25 mg total) by mouth 2 (two) times daily., Starting 12/27/2013, Until Discontinued, Print     !! - Potential duplicate medications found. Please discuss with provider.           Wynetta Emery, PA-C 12/27/13 1149

## 2013-12-27 NOTE — Discharge Instructions (Signed)
It is very important that you take your hypertensive medications as prescribed. Please use a resource guide the low to establish primary care. [employer], [employer], and [employer] or the cheapest places to obtain prescription medications.  Do not hesitate to return to the emergency room for a new, worsening or concerning symptoms including change in her vision, shortness of breath, chest pain, slurred speech.  Humidify the air and apply bacitracin to the inside of the nose this will help prevent future nosebleeds. If the bleeding recurs spray Afrin into the nose and hold pressure for at least 10 minutes.   Emergency Department Resource Guide 1) Find a Doctor and Pay Out of Pocket Although you won't have to find out who is covered by your insurance plan, it is a good idea to ask around and get recommendations. You will then need to call the office and see if the doctor you have chosen will accept you as a new patient and what types of options they offer for patients who are self-pay. Some doctors offer discounts or will set up payment plans for their patients who do not have insurance, but you will need to ask so you aren't surprised when you get to your appointment.  2) Contact Your Local Health Department Not all health departments have doctors that can see patients for sick visits, but many do, so it is worth a call to see if yours does. If you don't know where your local health department is, you can check in your phone book. The CDC also has a tool to help you locate your state's health department, and many state websites also have listings of all of their local health departments.  3) Find a Walk-in Clinic If your illness is not likely to be very severe or complicated, you may want to try a walk in clinic. These are popping up all over the country in pharmacies, drugstores, and shopping centers. They're usually staffed by nurse practitioners or physician assistants that have been trained to treat  common illnesses and complaints. They're usually fairly quick and inexpensive. However, if you have serious medical issues or chronic medical problems, these are probably not your best option.  No Primary Care Doctor: - Call Health Connect at  [phone] - they can help you locate a primary care doctor that  accepts your insurance, provides certain services, etc. - Physician Referral Service- [phone]  Chronic Pain Problems: Organization         Address  Phone   Notes  [facility]  [phone] Patients need to be referred by their primary care doctor.   Medication Assistance: Organization         Address  Phone   Notes  [facility] [address] [place], [place] [address] [phone] --Must be a resident of [place] [facility] -- Must have NO insurance coverage whatsoever (no Medicaid/ Medicare, etc.) -- The pt. MUST have a primary care doctor that directs their care regularly and follows them in the community   MedAssist  [phone]   [employer]  [phone]    Agencies that provide inexpensive medical care: Organization         Address  Phone   Notes  [facility]  [phone]   [facility]    [phone]) [phone]   [facility] [address] [place], [place] [address] [phone]) [phone]   [facility] [address], [place] [phone]   Planned Parenthood    [phone]   [facility]    [phone]   [facility]  [address], [place] Phone:  [phone], Fax:  [phone] Hours of Operation:  9 am - 6 pm, M-F.  Also accepts Medicaid/Medicare and self-pay.  [facility]  [address], [place] Phone: [phone], Fax: [phone]. Hours of Operation:  8:30 am - 5:30 pm, M-F.  Also accepts Medicaid and self-pay.  [facility] [place] [address] [place] Phone: [phone]   [facility] [address] Natasha Bence [place], [place] [phone], Ext. [phone] Mondays & Thursdays: 7-9 AM.  First 15 patients are seen on a first come, first serve basis.    Medicaid-accepting [facility] Providers:  Organization         Address  Phone   Notes  [facility] [address], [place] [phone] Also accepts self-pay patients.  [facility] [address] Laurell Josephs [place], [place]  [phone]   [facility] [address], [place] [phone]   [facility] [address], [place] [phone]   Renaye Rakers [address], [place]   [phone] Only accepts [place] patients after they have their name applied to their card.   Self-Pay (no insurance) in [facility]:  Organization         Address  Phone   Notes  Sickle Cell Patients, [facility] [address] [place], [place] [phone]   [facility] [address] [place], [place] [phone]   [facility]  [address], [place] [phone]   [facility]/Dr. Osei-Bonsu  [address], [place] or [address], [place] [phone] Phone number for both [place] and [place] locations is the same.  Urgent Medical and [facility] [address] [place] [phone]   [facility] [address], [place] or [address] [phone]   [facility] [address] [place] [phone], phone; [phone], fax Sees patients 1st and 3rd Saturday of every month.  Must not qualify for public or private insurance (i.e. Medicaid, Medicare, [place] Health Choice, Veterans' Benefits)  Household income should be no more than 200% of the  poverty level The clinic cannot treat you if you are pregnant or think you are pregnant  Sexually transmitted diseases are not treated at the clinic.    Dental Care: Organization         Address  Phone  Notes  [facility] Department of [facility] [address] [place], [place] [phone] Accepts children up to age 57 who are enrolled in [place] or [place] Health Choice; pregnant women with a Medicaid card; and children who have applied for Medicaid or [place] Health Choice, but were declined, whose parents can pay a reduced fee at time of service.  [facility] of [facility]  [address], [place] [phone] Accepts children up to age 73 who are enrolled in [place] or [place] Health Choice; pregnant women with a Medicaid card; and children who have applied for  Medicaid or [place] Health Choice, but were declined, whose parents can pay a reduced fee at time of service.  [facility]  [address] [place], [place] [phone] Patients are seen by appointment only. Walk-ins are not accepted. [facility] will see patients 13 years of age and older. Monday - Tuesday (8am-5pm) Most Wednesdays (8:30-5pm) $30 per visit, cash only  [facility] Adult Dental Access PROGRAM  [address], [facility] [phone] Patients are seen by appointment only. Walk-ins are not accepted. [facility] will see patients 72 years of age and older. One Wednesday Evening (Monthly: Volunteer Based).  $30 per visit, cash only  [employer]  [phone] for adults; Children under age 38, call Graduate Pediatric Dentistry at [phone]. Children aged 89-14, please call [phone] to request a pediatric application.  Dental services are provided in all areas of dental care including fillings, crowns and bridges, complete and partial dentures, implants, gum treatment, root canals, and extractions.  Preventive care is also provided. Treatment is provided to both adults and children. Patients are selected via a lottery and there is often a waiting list.   [facility] [address], [place]  [phone] [email or web address]   [facility] [address] [place], [place] [phone], Ext. [phone] Second and Fourth Thursday of each month, opens at 6:30 AM; Clinic ends at 9 AM.  Patients are seen on a first-come first-served basis, and a limited number are seen during each clinic.   [facility]  [address] Ether Griffins [place], [place] [phone]   Eligibility Requirements You must have lived in [place], [place], or [place] counties for at least the last three months.   You cannot be eligible for state or federal sponsored [place], including [employer], [place], or [employer].   You generally cannot be eligible for healthcare insurance through your employer.    How to apply: Eligibility screenings are held every Tuesday and Wednesday afternoon from 1:00 pm until 4:00 pm. You do not need an appointment for the interview!  [facility] [address], [place], [place] [phone]   [facility] Health Department  [phone]   [facility] Health Department  [phone]   [facility] Department  [phone]    Behavioral Health Resources in the Community: Intensive Outpatient Programs Organization         Address  Phone  Notes  [facility] [address] [place], [place] [phone]   [facility] Outpatient [address] [place], [place] [phone]   [facility]: Alcohol & Drug Svcs [address] [place], [place]  [phone]   [facility] [address]  [place], [place] [phone] or [phone]   Substance Abuse Resources Organization         Address  Phone  Notes  Alcohol and Drug Services  [phone]   Addiction  [facility]  [phone]   [facility]  [phone]   Floydene Flock  [phone]   Residential & Outpatient Substance Abuse Program  [phone]   Psychological Services Organization         Address  Phone  Notes  [facility]  [phone]   [facility]  [phone]   [facility] [address], [place] [phone] or [phone]    Mobile Crisis Teams Organization         Address  Phone  Notes  [facility], Mobile Crisis Care Unit  [phone]   [facility]  [address] [place], [place] [phone]   [facility] De[facility] [address] [place] [phone]    Self-Help/Support Groups Organization         Address  Phone             Notes  [facility] [place] - variety of support groups  [phone]- [phone] Call for more information  Narcotics Anonymous (NA), [facility] [address], [place]  2 meetings at this location   Statisticianesidential Treatment Programs Organization         Address  Phone  Notes  [facility] [address],    [place]  [phone]   [facility]  [address], [place] [phone], [place], [place] [phone]   [facility] [address], [place] [phone] Admissions: 8am-3pm M-F  [facility] [address]    [place], [place] [phone]   [facility] [address], [place], [place] [phone]   [facility] [address]  [place], [place] [phone]   [facility] - Intensive Outpatient [address], [place], [place] [phone]   [facility] ([facility] [address]  [place], [place] [phone] or [phone]   [facility] ([facility]) [address] [place], [place] [phone] Accepts Medicaid  [facility] [address]  [place]  [phone] Substance Abuse/Addiction Treatment   [facility] Behavioral Health Resources Organization         Address  Phone  Notes  [facility]  [phone]) [phone]   Angie FavaJulie Brannon, PhD [address] [place], [place]   [phone] or [phone]) [phone]   [facility]   [address] [place], [place] [phone](336) [phone]   [facility] [address], [place], [place] [phone](336) [phone] Insurance/Medicaid/sponsorship through [facility]  [facility] Families [address]                                    [place], [place] [phone](336) [phone] Therapy/tele-psych/case  [facility] [address]   [place], [place] [phone](336) [phone]    Dr. Lolly MustacheArfeen  [phone]) [phone]   [facility] [place]  [employer] [facility] 1) [address], [place] 2) [address], [place] 3)  [address], [place] [phone](336) [phone](336) [phone]  [phone](336) [phone]   [facility] Child Abuse Hotline [phone](336) [phone] or [phone]) [phone] (After Hours)

## 2013-12-27 NOTE — Progress Notes (Signed)
[facility] Community Liaison at [place], covering [place]: ° °Will send patient information on [facility] Orange Card program and a list of primary care resources to help patient establish primary care.  °

## 2014-05-14 ENCOUNTER — Emergency Department (HOSPITAL_COMMUNITY)
Admission: EM | Admit: 2014-05-14 | Discharge: 2014-05-14 | Disposition: A | Discharge status: Home / Self Care | Payer: Self-pay | Attending: Emergency Medicine | Admitting: Emergency Medicine

## 2014-05-14 ENCOUNTER — Encounter (HOSPITAL_COMMUNITY): Payer: Self-pay | Admitting: Emergency Medicine

## 2014-05-14 DIAGNOSIS — I10 Essential (primary) hypertension: Secondary | ICD-10-CM | POA: Insufficient documentation

## 2014-05-14 DIAGNOSIS — Z760 Encounter for issue of repeat prescription: Secondary | ICD-10-CM

## 2014-05-14 DIAGNOSIS — R040 Epistaxis: Secondary | ICD-10-CM

## 2014-05-14 MED ORDER — METOPROLOL TARTRATE 25 MG PO TABS: 25.0000 mg | ORAL_TABLET | Freq: Two times a day (BID) | ORAL | Status: DC

## 2014-05-14 MED ORDER — AMLODIPINE BESYLATE 5 MG PO TABS: 5.0000 mg | ORAL_TABLET | Freq: Every day | ORAL | Status: DC

## 2014-05-14 NOTE — ED Provider Notes (Signed)
CSN: [id number]     Arrival date & time 05/14/14  0715 History   First MD Initiated Contact with Patient 05/14/14 [phone]     Chief Complaint  Patient presents with  . Epistaxis      HPI Pt reports left sided nosebleed since 1 am. Unable to stop bleeding with pressure and gauze.  Has not tried any sprays.  No history of recurrent nosebleeds.  Denies use of anticoagulants.  He is also requesting a refill of his blood pressure medications.  Symptoms are mild in severity.  Still with some bleeding at this time.  Denies easy bruising.   Past Medical History  Diagnosis Date  . Hypertension   . Nosebleed    History reviewed. No pertinent past surgical history. No family history on file. History  Substance Use Topics  . Smoking status: Never Smoker   . Smokeless tobacco: Never Used  . Alcohol Use: No    Review of Systems  All other systems reviewed and are negative.     Allergies  Review of patient's allergies indicates no known allergies.  Home Medications   Prior to Admission medications   Medication Sig Start Date End Date Taking? Authorizing Provider  acetaminophen (TYLENOL) 325 MG tablet Take 2 tablets (650 mg total) by mouth every 6 (six) hours as needed for mild pain (or Fever >/= 101). 09/27/13   Esperanza SheetsUlugbek N Buriev, MD  amLODipine (NORVASC) 5 MG tablet Take 1 tablet (5 mg total) by mouth daily. 05/14/14   Lyanne CoKevin M Antonie Borjon, MD  metoprolol tartrate (LOPRESSOR) 25 MG tablet Take 1 tablet (25 mg total) by mouth 2 (two) times daily. 05/14/14   Lyanne CoKevin M Britainy Kozub, MD   BP 188/134 mmHg  Pulse 94  Temp(Src) 98.4 F (36.9 C) (Oral)  Resp 18  Ht 5\' 8"  (1.727 m)  Wt 295 lb (133.811 kg)  BMI 44.86 kg/m2  SpO2 98% Physical Exam  Constitutional: He is oriented to person, place, and time. He appears well-developed and well-nourished.  HENT:  Head: Normocephalic.  No blood in the posterior pharynx.  No bleeding from his right ear.  Small amount of anterior bleeding noted from his left  neck.  Unable to visualize actual bleeding site.  Eyes: EOM are normal.  Neck: Normal range of motion.  Pulmonary/Chest: Effort normal.  Abdominal: He exhibits no distension.  Musculoskeletal: Normal range of motion.  Neurological: He is alert and oriented to person, place, and time.  Psychiatric: He has a normal mood and affect.  Nursing note and vitals reviewed.   ED Course  EPISTAXIS MANAGEMENT Date/Time: 05/14/2014 8:44 AM Performed by: Lyanne CoAMPOS, Jamas Jaquay M Authorized by: Lyanne CoAMPOS, Savannaha Stonerock M Consent: Verbal consent obtained. Risks and benefits: risks, benefits and alternatives were discussed Consent given by: patient Patient identity confirmed: verbally with patient Local anesthetic: co-phenylcaine spray Patient sedated: no Treatment site: left anterior Post-procedure assessment: bleeding stopped Treatment complexity: simple Patient tolerance: Patient tolerated the procedure well with no immediate complications Comments: Controlled with 10 minutes of direct pressure, ice and phenylephrine spray   (including critical care time) Labs Review Labs Reviewed - No data to display  Imaging Review No results found.   EKG Interpretation None      MDM   Final diagnoses:  Left-sided epistaxis  Medication refill    8:45 AM Bleeding controlled. htn med refills. Pt will return for new or worsening symptoms    Lyanne CoKevin M Tiasia Weberg, MD 05/14/14 [phone]

## 2014-05-14 NOTE — ED Notes (Signed)
Dr. Campos back at the bedside.  

## 2014-05-14 NOTE — Discharge Instructions (Signed)

## 2014-05-14 NOTE — ED Notes (Signed)
Dr. Campos at the bedside.  

## 2014-05-14 NOTE — ED Notes (Addendum)
ENT cart placed outside of room. Pt states he was seen here last night for the same but the nosebleed had stopped when he arrived. Has been out of his BP meds for about a month and a half now.

## 2014-05-14 NOTE — ED Notes (Signed)
Patient coming from home with an active nosebleed.  Patient states the nosebleed started around 0100am this morning.

## 2014-06-29 DIAGNOSIS — R1110 Vomiting, unspecified: Secondary | ICD-10-CM | POA: Insufficient documentation

## 2014-06-29 DIAGNOSIS — R42 Dizziness and giddiness: Secondary | ICD-10-CM | POA: Insufficient documentation

## 2014-06-29 DIAGNOSIS — I10 Essential (primary) hypertension: Secondary | ICD-10-CM | POA: Insufficient documentation

## 2014-06-30 ENCOUNTER — Emergency Department (HOSPITAL_COMMUNITY)
Admission: EM | Admit: 2014-06-30 | Discharge: 2014-06-30 | Disposition: A | Discharge status: Home / Self Care | Payer: Self-pay | Attending: Emergency Medicine | Admitting: Emergency Medicine

## 2014-06-30 ENCOUNTER — Emergency Department (HOSPITAL_COMMUNITY): Payer: Self-pay

## 2014-06-30 ENCOUNTER — Encounter (HOSPITAL_COMMUNITY): Payer: Self-pay | Admitting: *Deleted

## 2014-06-30 DIAGNOSIS — R42 Dizziness and giddiness: Secondary | ICD-10-CM

## 2014-06-30 LAB — URINALYSIS, ROUTINE W REFLEX MICROSCOPIC
Bilirubin Urine: NEGATIVE
GLUCOSE, UA: NEGATIVE mg/dL
Hgb urine dipstick: NEGATIVE
KETONES UR: NEGATIVE mg/dL
Leukocytes, UA: NEGATIVE
Nitrite: NEGATIVE
PROTEIN: NEGATIVE mg/dL
Specific Gravity, Urine: 1.022 (ref 1.005–1.030)
Urobilinogen, UA: 1 mg/dL (ref 0.0–1.0)
pH: 7 (ref 5.0–8.0)

## 2014-06-30 LAB — CBC WITH DIFFERENTIAL/PLATELET
BASOS ABS: 0 10*3/uL (ref 0.0–0.1)
BASOS PCT: 0 % (ref 0–1)
EOS ABS: 0 10*3/uL (ref 0.0–0.7)
EOS PCT: 0 % (ref 0–5)
HCT: 40 % (ref 39.0–52.0)
HEMOGLOBIN: 12.9 g/dL — AB (ref 13.0–17.0)
LYMPHS ABS: 1.2 10*3/uL (ref 0.7–4.0)
LYMPHS PCT: 12 % (ref 12–46)
MCH: 25.7 pg — ABNORMAL LOW (ref 26.0–34.0)
MCHC: 32.3 g/dL (ref 30.0–36.0)
MCV: 79.8 fL (ref 78.0–100.0)
MONOS PCT: 4 % (ref 3–12)
Monocytes Absolute: 0.5 10*3/uL (ref 0.1–1.0)
Neutro Abs: 8.8 10*3/uL — ABNORMAL HIGH (ref 1.7–7.7)
Neutrophils Relative %: 84 % — ABNORMAL HIGH (ref 43–77)
Platelets: 374 10*3/uL (ref 150–400)
RBC: 5.01 MIL/uL (ref 4.22–5.81)
RDW: 15.3 % (ref 11.5–15.5)
WBC: 10.5 10*3/uL (ref 4.0–10.5)

## 2014-06-30 LAB — COMPREHENSIVE METABOLIC PANEL
ALK PHOS: 96 U/L (ref 39–117)
ALT: 14 U/L (ref 0–53)
AST: 25 U/L (ref 0–37)
Albumin: 4.3 g/dL (ref 3.5–5.2)
Anion gap: 9 (ref 5–15)
BUN: 7 mg/dL (ref 6–23)
CALCIUM: 9 mg/dL (ref 8.4–10.5)
CO2: 27 mmol/L (ref 19–32)
Chloride: 102 mmol/L (ref 96–112)
Creatinine, Ser: 1.02 mg/dL (ref 0.50–1.35)
GFR calc Af Amer: >90 mL/min (ref >90)
GFR calc non Af Amer: 80 mL/min — ABNORMAL LOW (ref >90)
GLUCOSE: 154 mg/dL — AB (ref 70–99)
Potassium: 3.1 mmol/L — ABNORMAL LOW (ref 3.5–5.1)
Sodium: 138 mmol/L (ref 135–145)
TOTAL PROTEIN: 9 g/dL — AB (ref 6.0–8.3)
Total Bilirubin: 0.5 mg/dL (ref 0.3–1.2)

## 2014-06-30 LAB — TROPONIN I: Troponin I: <0.03 ng/mL (ref <0.031)

## 2014-06-30 MED ORDER — SODIUM CHLORIDE 0.9 % IV SOLN
Freq: Once | INTRAVENOUS | Status: AC
  Administered 2014-06-30: 01:00:00 via INTRAVENOUS

## 2014-06-30 MED ORDER — MECLIZINE HCL 25 MG PO TABS: 25.0000 mg | ORAL_TABLET | Freq: Three times a day (TID) | ORAL | Status: DC | PRN

## 2014-06-30 MED ORDER — POTASSIUM CHLORIDE CRYS ER 20 MEQ PO TBCR
40.0000 meq | EXTENDED_RELEASE_TABLET | Freq: Once | ORAL | Status: AC
  Administered 2014-06-30: 40 meq via ORAL
  Filled 2014-06-30: qty 2

## 2014-06-30 MED ORDER — ONDANSETRON HCL 4 MG PO TABS: 4.0000 mg | ORAL_TABLET | Freq: Four times a day (QID) | ORAL | Status: DC

## 2014-06-30 MED ORDER — MECLIZINE HCL 25 MG PO TABS
50.0000 mg | ORAL_TABLET | Freq: Once | ORAL | Status: AC
  Administered 2014-06-30: 50 mg via ORAL
  Filled 2014-06-30: qty 2

## 2014-06-30 MED ORDER — SODIUM CHLORIDE 0.9 % IV BOLUS (SEPSIS)
1000.0000 mL | Freq: Once | INTRAVENOUS | Status: AC
  Administered 2014-06-30: 1000 mL via INTRAVENOUS

## 2014-06-30 NOTE — ED Notes (Signed)
Patient now c/o dizziness and vomiting in triage

## 2014-06-30 NOTE — ED Provider Notes (Signed)
CSN: [id number]     Arrival date & time 06/29/14  2354 History   First MD Initiated Contact with Patient 06/30/14 0011     Chief Complaint  Patient presents with  . Weakness     (Consider location/radiation/quality/duration/timing/severity/associated sxs/prior Treatment) Patient is a 59 y.o. male presenting with weakness. The history is provided by the patient. No language interpreter was used.  Weakness This is a new problem. The current episode started today. Associated symptoms include vomiting and weakness. Pertinent negatives include no abdominal pain, chest pain, chills, fever, headaches or rash. Associated symptoms comments: He complains of feeling generally weak, "washed out", dizzy with standing like surroundings spinning, and off balance when walking. Symptoms for 1-2 days. No fever. He has had vomiting after arrival to ED. No similar symptoms in the past. He denies headache, chest pain, SOB, diarrhea, difficult speech or visual impairment..    Past Medical History  Diagnosis Date  . Hypertension   . Nosebleed    History reviewed. No pertinent past surgical history. No family history on file. History  Substance Use Topics  . Smoking status: Never Smoker   . Smokeless tobacco: Never Used  . Alcohol Use: No    Review of Systems  Constitutional: Negative for fever and chills.  HENT: Negative.   Respiratory: Negative.  Negative for shortness of breath.   Cardiovascular: Negative.  Negative for chest pain.  Gastrointestinal: Positive for vomiting. Negative for abdominal pain.  Musculoskeletal: Negative.  Negative for neck stiffness.  Skin: Negative.  Negative for rash.  Neurological: Positive for weakness. Negative for syncope, speech difficulty and headaches.      Allergies  Review of patient's allergies indicates no known allergies.  Home Medications   Prior to Admission medications   Medication Sig Start Date End Date Taking? Authorizing Provider   acetaminophen (TYLENOL) 325 MG tablet Take 2 tablets (650 mg total) by mouth every 6 (six) hours as needed for mild pain (or Fever >/= 101). 09/27/13  Yes Esperanza Sheets, MD  amLODipine (NORVASC) 5 MG tablet Take 1 tablet (5 mg total) by mouth daily. 05/14/14  Yes Lyanne Co, MD  metoprolol tartrate (LOPRESSOR) 25 MG tablet Take 1 tablet (25 mg total) by mouth 2 (two) times daily. 05/14/14  Yes Lyanne Co, MD   BP 167/92 mmHg  Pulse 91  Temp(Src) 97.8 F (36.6 C) (Oral)  Resp 17  Ht  (1.727 m)  Wt 300 lb (136.079 kg)  BMI 45.63 kg/m2  SpO2 100% Physical Exam  Constitutional: He is oriented to person, place, and time. He appears well-developed and well-nourished.  HENT:  Head: Normocephalic and atraumatic.  Eyes: EOM are normal. Pupils are equal, round, and reactive to light.  Neck: Normal range of motion.  Cardiovascular: Normal rate and regular rhythm.   No murmur heard. Pulmonary/Chest: Effort normal and breath sounds normal. He has no wheezes. He has no rales.  Abdominal: Soft. There is no tenderness.  Musculoskeletal: He exhibits no edema.  Neurological: He is alert and oriented to person, place, and time. He has normal strength and normal reflexes. No sensory deficit. He displays a negative Romberg sign. Coordination normal.  CN's 3-12 intact. Speech clear and focused. No lateralizing weakness. No coordination loss.   Skin: Skin is warm and dry.    ED Course  Procedures (including critical care time) Labs Review Labs Reviewed  CBC WITH DIFFERENTIAL/PLATELET - Abnormal; Notable for the following:    Hemoglobin 12.9 (*)  MCH 25.7 (*)    Neutrophils Relative % 84 (*)    Neutro Abs 8.8 (*)    All other components within normal limits  COMPREHENSIVE METABOLIC PANEL - Abnormal; Notable for the following:    Potassium 3.1 (*)    Glucose, Bld 154 (*)    Total Protein 9.0 (*)    GFR calc non Af Amer 80 (*)    All other components within normal limits   TROPONIN I  URINALYSIS, ROUTINE W REFLEX MICROSCOPIC   Results for orders placed or performed during the hospital encounter of 06/30/14  CBC with Differential  Result Value Ref Range   WBC 10.5 4.0 - 10.5 K/uL   RBC 5.01 4.22 - 5.81 MIL/uL   Hemoglobin 12.9 (L) 13.0 - 17.0 g/dL   HCT 16.140.0 09.639.0 - 04.552.0 %   MCV 79.8 78.0 - 100.0 fL   MCH 25.7 (L) 26.0 - 34.0 pg   MCHC 32.3 30.0 - 36.0 g/dL   RDW 40.915.3 81.111.5 - 91.415.5 %   Platelets 374 150 - 400 K/uL   Neutrophils Relative % 84 (H) 43 - 77 %   Neutro Abs 8.8 (H) 1.7 - 7.7 K/uL   Lymphocytes Relative 12 12 - 46 %   Lymphs Abs 1.2 0.7 - 4.0 K/uL   Monocytes Relative 4 3 - 12 %   Monocytes Absolute 0.5 0.1 - 1.0 K/uL   Eosinophils Relative 0 0 - 5 %   Eosinophils Absolute 0.0 0.0 - 0.7 K/uL   Basophils Relative 0 0 - 1 %   Basophils Absolute 0.0 0.0 - 0.1 K/uL  Troponin I  Result Value Ref Range   Troponin I <0.03 <0.031 ng/mL  Comprehensive metabolic panel  Result Value Ref Range   Sodium 138 135 - 145 mmol/L   Potassium 3.1 (L) 3.5 - 5.1 mmol/L   Chloride 102 96 - 112 mmol/L   CO2 27 19 - 32 mmol/L   Glucose, Bld 154 (H) 70 - 99 mg/dL   BUN 7 6 - 23 mg/dL   Creatinine, Ser 7.821.02 0.50 - 1.35 mg/dL   Calcium 9.0 8.4 - 95.610.5 mg/dL   Total Protein 9.0 (H) 6.0 - 8.3 g/dL   Albumin 4.3 3.5 - 5.2 g/dL   AST 25 0 - 37 U/L   ALT 14 0 - 53 U/L   Alkaline Phosphatase 96 39 - 117 U/L   Total Bilirubin 0.5 0.3 - 1.2 mg/dL   GFR calc non Af Amer 80 (L) >90 mL/min   GFR calc Af Amer >90 >90 mL/min   Anion gap 9 5 - 15  Urinalysis, Routine w reflex microscopic  Result Value Ref Range   Color, Urine YELLOW YELLOW   APPearance CLEAR CLEAR   Specific Gravity, Urine 1.022 1.005 - 1.030   pH 7.0 5.0 - 8.0   Glucose, UA NEGATIVE NEGATIVE mg/dL   Hgb urine dipstick NEGATIVE NEGATIVE   Bilirubin Urine NEGATIVE NEGATIVE   Ketones, ur NEGATIVE NEGATIVE mg/dL   Protein, ur NEGATIVE NEGATIVE mg/dL   Urobilinogen, UA 1.0 0.0 - 1.0 mg/dL   Nitrite  NEGATIVE NEGATIVE   Leukocytes, UA NEGATIVE NEGATIVE    Imaging Review Dg Chest Portable 1 View  06/30/2014   CLINICAL DATA:  Acute onset of weakness and lightheadedness. Initial encounter.  EXAM: PORTABLE CHEST - 1 VIEW  COMPARISON:  Chest radiograph from 09/26/2013  FINDINGS: The lungs are well-aerated and clear. There is no evidence of focal opacification, pleural effusion or pneumothorax.  The cardiomediastinal silhouette is borderline normal in size. No acute osseous abnormalities are seen.  IMPRESSION: No acute cardiopulmonary process seen.   Electronically Signed   By: Roanna Raider M.D.   On: 06/30/2014 01:43     EKG Interpretation   Date/Time:  Monday June 30 2014 00:12:23 EST Ventricular Rate:  102 PR Interval:  179 QRS Duration: 111 QT Interval:  371 QTC Calculation: 483 R Axis:   -21 Text Interpretation:  Sinus tachycardia Premature ventricular complexes  Probable left atrial enlargement Abnormal R-wave progression, early  transition Left ventricular hypertrophy Borderline prolonged QT interval  When compared with ECG of 09/26/2013, Nonspecific T wave abnormality is no  longer Present Confirmed by [facility]  MD, DAVID ([id number]) on 06/30/2014 1:06:18  AM      MDM   Final diagnoses:  None   1. Vertigo  The patient is given IV fluids with some improvement. No orthostasis, however, patient feels dizzy, room spinning when standing. No recurrent 'room spinning' when lying and turning head side to side. No further vomiting in ED.  Labs essentially unremarkable. Potassium supplemented in ED.  He is feeling better after meclizine. Ambulatory with less dizziness. No neurologic deficits. Will treat condition as vertigo with meclizine at home and encourage recheck with his doctor in 1-2 days.  Arnoldo Hooker, PA-C 06/30/14 0555  Dione Booze, MD 06/30/14 [id number]

## 2014-06-30 NOTE — ED Notes (Signed)
Pt ambulated in POD A hallway with walker and 2-person assist with slow gait. Pt reported he was just slightly dizzy but he did report he felt much better. Melvenia BeamShari, PA witnessed the ambulation.

## 2014-06-30 NOTE — ED Notes (Signed)
Patient presents with c/o feeling weak

## 2014-06-30 NOTE — ED Notes (Signed)
Shari, PA at bedside. 

## 2014-06-30 NOTE — Discharge Instructions (Signed)
Benign Positional Vertigo Vertigo means you feel like you or your surroundings are moving when they are not. Benign positional vertigo is the most common form of vertigo. Benign means that the cause of your condition is not serious. Benign positional vertigo is more common in older adults. CAUSES  Benign positional vertigo is the result of an upset in the labyrinth system. This is an area in the middle ear that helps control your balance. This may be caused by a viral infection, head injury, or repetitive motion. However, often no specific cause is found. SYMPTOMS  Symptoms of benign positional vertigo occur when you move your head or eyes in different directions. Some of the symptoms may include:  Loss of balance and falls.  Vomiting.  Blurred vision.  Dizziness.  Nausea.  Involuntary eye movements (nystagmus). DIAGNOSIS  Benign positional vertigo is usually diagnosed by physical exam. If the specific cause of your benign positional vertigo is unknown, your caregiver may perform imaging tests, such as magnetic resonance imaging (MRI) or computed tomography (CT). TREATMENT  Your caregiver may recommend movements or procedures to correct the benign positional vertigo. Medicines such as meclizine, benzodiazepines, and medicines for nausea may be used to treat your symptoms. In rare cases, if your symptoms are caused by certain conditions that affect the inner ear, you may need surgery. HOME CARE INSTRUCTIONS   Follow your caregiver's instructions.  Move slowly. Do not make sudden body or head movements.  Avoid driving.  Avoid operating heavy machinery.  Avoid performing any tasks that would be dangerous to you or others during a vertigo episode.  Drink enough fluids to keep your urine clear or pale yellow. SEEK IMMEDIATE MEDICAL CARE IF:   You develop problems with walking, weakness, numbness, or using your arms, hands, or legs.  You have difficulty speaking.  You develop  severe headaches.  Your nausea or vomiting continues or gets worse.  You develop visual changes.  Your family or friends notice any behavioral changes.  Your condition gets worse.  You have a fever.  You develop a stiff neck or sensitivity to light. MAKE SURE YOU:   Understand these instructions.  Will watch your condition.  Will get help right away if you are not doing well or get worse. Document Released: 02/21/2006 Document Revised: 08/08/2011 Document Reviewed: 02/03/2011 Exit[employer] Patient Information 2015 [employer]. This information is not intended to replace advice given to you by your health care provider. Make sure you discuss any questions you have with your health care provider.    

## 2015-10-28 ENCOUNTER — Emergency Department (HOSPITAL_COMMUNITY): Payer: Self-pay

## 2015-10-28 ENCOUNTER — Encounter (HOSPITAL_COMMUNITY): Payer: Self-pay | Admitting: Emergency Medicine

## 2015-10-28 ENCOUNTER — Emergency Department (HOSPITAL_COMMUNITY)
Admission: EM | Admit: 2015-10-28 | Discharge: 2015-10-28 | Disposition: A | Discharge status: Home / Self Care | Payer: Self-pay | Attending: Emergency Medicine | Admitting: Emergency Medicine

## 2015-10-28 DIAGNOSIS — J209 Acute bronchitis, unspecified: Secondary | ICD-10-CM | POA: Insufficient documentation

## 2015-10-28 DIAGNOSIS — E876 Hypokalemia: Secondary | ICD-10-CM | POA: Insufficient documentation

## 2015-10-28 DIAGNOSIS — I10 Essential (primary) hypertension: Secondary | ICD-10-CM | POA: Insufficient documentation

## 2015-10-28 DIAGNOSIS — R0989 Other specified symptoms and signs involving the circulatory and respiratory systems: Secondary | ICD-10-CM | POA: Insufficient documentation

## 2015-10-28 DIAGNOSIS — J40 Bronchitis, not specified as acute or chronic: Secondary | ICD-10-CM

## 2015-10-28 DIAGNOSIS — Z79899 Other long term (current) drug therapy: Secondary | ICD-10-CM | POA: Insufficient documentation

## 2015-10-28 LAB — CBC
HCT: 39 % (ref 39.0–52.0)
Hemoglobin: 13.1 g/dL (ref 13.0–17.0)
MCH: 28.9 pg (ref 26.0–34.0)
MCHC: 33.6 g/dL (ref 30.0–36.0)
MCV: 85.9 fL (ref 78.0–100.0)
PLATELETS: 281 10*3/uL (ref 150–400)
RBC: 4.54 MIL/uL (ref 4.22–5.81)
RDW: 13.3 % (ref 11.5–15.5)
WBC: 4.7 10*3/uL (ref 4.0–10.5)

## 2015-10-28 LAB — COMPREHENSIVE METABOLIC PANEL
ALBUMIN: 3.8 g/dL (ref 3.5–5.0)
ALK PHOS: 72 U/L (ref 38–126)
ALT: 12 U/L — AB (ref 17–63)
AST: 17 U/L (ref 15–41)
Anion gap: 6 (ref 5–15)
BUN: 7 mg/dL (ref 6–20)
CALCIUM: 8.8 mg/dL — AB (ref 8.9–10.3)
CO2: 28 mmol/L (ref 22–32)
CREATININE: 1.08 mg/dL (ref 0.61–1.24)
Chloride: 103 mmol/L (ref 101–111)
GFR calc Af Amer: >60 mL/min (ref >60)
GFR calc non Af Amer: >60 mL/min (ref >60)
GLUCOSE: 124 mg/dL — AB (ref 65–99)
Potassium: 2.9 mmol/L — ABNORMAL LOW (ref 3.5–5.1)
Sodium: 137 mmol/L (ref 135–145)
TOTAL PROTEIN: 7.6 g/dL (ref 6.5–8.1)
Total Bilirubin: 0.7 mg/dL (ref 0.3–1.2)

## 2015-10-28 LAB — CBG MONITORING, ED: GLUCOSE-CAPILLARY: 124 mg/dL — AB (ref 65–99)

## 2015-10-28 LAB — LIPASE, BLOOD: Lipase: 25 U/L (ref 11–51)

## 2015-10-28 LAB — MAGNESIUM: Magnesium: 2.1 mg/dL (ref 1.7–2.4)

## 2015-10-28 MED ORDER — PREDNISONE 20 MG PO TABS: 60.0000 mg | ORAL_TABLET | Freq: Once | ORAL | Status: DC

## 2015-10-28 MED ORDER — FUROSEMIDE 20 MG PO TABS: 20.0000 mg | ORAL_TABLET | Freq: Every day | ORAL | Status: DC

## 2015-10-28 MED ORDER — PREDNISONE 20 MG PO TABS: 60.0000 mg | ORAL_TABLET | Freq: Every day | ORAL | Status: DC

## 2015-10-28 MED ORDER — POTASSIUM CHLORIDE ER 10 MEQ PO TBCR: 10.0000 meq | EXTENDED_RELEASE_TABLET | Freq: Every day | ORAL | Status: DC

## 2015-10-28 MED ORDER — METOPROLOL TARTRATE 50 MG PO TABS: 25.0000 mg | ORAL_TABLET | Freq: Two times a day (BID) | ORAL | Status: DC

## 2015-10-28 MED ORDER — ALBUTEROL SULFATE HFA 108 (90 BASE) MCG/ACT IN AERS: 2.0000 | INHALATION_SPRAY | RESPIRATORY_TRACT | Status: DC | PRN

## 2015-10-28 MED ORDER — DOXYCYCLINE HYCLATE 100 MG PO CAPS: 100.0000 mg | ORAL_CAPSULE | Freq: Two times a day (BID) | ORAL | Status: DC

## 2015-10-28 MED ORDER — PREDNISONE 20 MG PO TABS
60.0000 mg | ORAL_TABLET | Freq: Once | ORAL | Status: AC
  Administered 2015-10-28: 60 mg via ORAL
  Filled 2015-10-28: qty 3

## 2015-10-28 MED ORDER — ALBUTEROL SULFATE HFA 108 (90 BASE) MCG/ACT IN AERS
2.0000 | INHALATION_SPRAY | Freq: Once | RESPIRATORY_TRACT | Status: AC
  Administered 2015-10-28: 2 via RESPIRATORY_TRACT
  Filled 2015-10-28: qty 6.7

## 2015-10-28 MED ORDER — DOXYCYCLINE HYCLATE 100 MG PO TABS
100.0000 mg | ORAL_TABLET | Freq: Once | ORAL | Status: AC
  Administered 2015-10-28: 100 mg via ORAL
  Filled 2015-10-28: qty 1

## 2015-10-28 NOTE — ED Notes (Signed)
The pt reports that he has had a cough for 3 weeks no temp  He is also complaining of a lt arm  Tremor that he has also had for 3 weeks

## 2015-10-28 NOTE — ED Notes (Signed)
Pt sts some intermittent vomiting and not taking htn meds; pt sts some shaking today and cough

## 2015-10-28 NOTE — ED Provider Notes (Signed)
CSN: [id number]     Arrival date & time 10/28/15  1753 History   First MD Initiated Contact with Patient 10/28/15 2126     Chief Complaint  Patient presents with  . Emesis     (Consider location/radiation/quality/duration/timing/severity/associated sxs/prior Treatment) Patient is a 60 y.o. male presenting with general illness. The history is provided by the patient.  Illness Location:  Cough Severity:  Moderate Onset quality:  Gradual Duration:  1 month Timing:  Constant Progression:  Unchanged Chronicity:  New Associated symptoms: cough   Associated symptoms: no abdominal pain, no chest pain, no diarrhea, no fever, no headaches, no nausea, no shortness of breath and no vomiting     Past Medical History  Diagnosis Date  . Hypertension   . Nosebleed    History reviewed. No pertinent past surgical history. History reviewed. No pertinent family history. Social History  Substance Use Topics  . Smoking status: Never Smoker   . Smokeless tobacco: Never Used  . Alcohol Use: No    Review of Systems  Constitutional: Negative for fever and chills.  Respiratory: Positive for cough. Negative for shortness of breath.   Cardiovascular: Negative for chest pain, palpitations and leg swelling.  Gastrointestinal: Negative for nausea, vomiting, abdominal pain and diarrhea.  Musculoskeletal: Negative for back pain.  Neurological: Negative for light-headedness and headaches.  All other systems reviewed and are negative.     Allergies  Review of patient's allergies indicates no known allergies.  Home Medications   Prior to Admission medications   Medication Sig Start Date End Date Taking? Authorizing Provider  acetaminophen (TYLENOL) 325 MG tablet Take 2 tablets (650 mg total) by mouth every 6 (six) hours as needed for mild pain (or Fever >/= 101). 09/27/13   Esperanza SheetsUlugbek N Buriev, MD  amLODipine (NORVASC) 5 MG tablet Take 1 tablet (5 mg total) by mouth daily. 05/14/14   Azalia BilisKevin Campos, MD   meclizine (ANTIVERT) 25 MG tablet Take 1 tablet (25 mg total) by mouth 3 (three) times daily as needed for dizziness. 06/30/14   Elpidio AnisShari Upstill, PA-C  metoprolol tartrate (LOPRESSOR) 25 MG tablet Take 1 tablet (25 mg total) by mouth 2 (two) times daily. 05/14/14   Azalia BilisKevin Campos, MD  ondansetron (ZOFRAN) 4 MG tablet Take 1 tablet (4 mg total) by mouth every 6 (six) hours. 06/30/14   Shari Upstill, PA-C   BP 149/93 mmHg  Pulse 93  Temp(Src) 99 F (37.2 C) (Oral)  Resp 18  SpO2 99% Physical Exam  Constitutional: He is oriented to person, place, and time. He appears well-developed and well-nourished. No distress.  HENT:  Head: Normocephalic and atraumatic.  Eyes: EOM are normal. Pupils are equal, round, and reactive to light.  Cardiovascular: Normal rate, regular rhythm, S1 normal, S2 normal and intact distal pulses.   Pulmonary/Chest: Effort normal. No tachypnea. No respiratory distress. He has wheezes. He has rhonchi.  Abdominal: Soft. There is no tenderness. There is no rebound and no guarding.  Musculoskeletal: He exhibits no edema.       Right lower leg: He exhibits no swelling and no edema.       Left lower leg: He exhibits no swelling and no edema.  Neurological: He is alert and oriented to person, place, and time. He has normal strength. No sensory deficit. GCS eye subscore is 4. GCS verbal subscore is 5. GCS motor subscore is 6.  Skin: Skin is warm and dry.    ED Course  Procedures (including critical care time) Labs Review  Labs Reviewed  COMPREHENSIVE METABOLIC PANEL - Abnormal; Notable for the following:    Potassium 2.9 (*)    Glucose, Bld 124 (*)    Calcium 8.8 (*)    ALT 12 (*)    All other components within normal limits  CBG MONITORING, ED - Abnormal; Notable for the following:    Glucose-Capillary 124 (*)    All other components within normal limits  CBC  LIPASE, BLOOD  MAGNESIUM    Imaging Review Dg Chest 2 View  10/28/2015  CLINICAL DATA:  Subacute onset of  productive cough and left hand shaking. Initial encounter. EXAM: CHEST  2 VIEW COMPARISON:  Chest radiograph from 06/30/2014 FINDINGS: The lungs are well-aerated. Mild vascular congestion is noted. There is no evidence of focal opacification, pleural effusion or pneumothorax. The heart is normal in size; the mediastinal contour is within normal limits. No acute osseous abnormalities are seen. IMPRESSION: Mild vascular congestion noted.  Lungs remain grossly clear. Electronically Signed   By: Roanna Raider M.D.   On: 10/28/2015 18:50   I have personally reviewed and evaluated these images and lab results as part of my medical decision-making.   EKG Interpretation None      MDM   Final diagnoses:  Bronchitis  Hypokalemia  Pulmonary vascular congestion    60 year old male with no prior past medical history presenting with likely bronchitis. Reports 4 weeks of cough, productive of sputum. No fevers and chills. Arrival here the patient is well-appearing in no acute distress. Vital signs are normal and stable. He does have some mild end expiratory wheezes and rhonchi appreciated diffusely. We'll give the patient some prednisone as well as a albuterol inhaler. Chest x-ray without evidence of pneumonia. Given the length of time in the patient's advanced age will give him a prescription for doxycycline for bronchitis. Chest x-ray does show evidence of mild pulmonary vascular congestion. No history of heart failure. No lower extremity edema. Has no tachypnea, hypoxia, tachycardia on exam. No Oxygen requirement. Told to patient that he will need to follow-up with her primary care doctor for outpatient ultrasound. Starting the patient on low-dose Lasix. Provided him information to contact outpatient cardiology. No leukocytosis. No anemia.   Patient is significantly hypokalemic to 2.9. Repleting here. Magnesium level WNL. We'll give him a prescription for potassium. Told him he will need to follow-up with a  primary care doctor to recheck his potassium in the next few days.   Patient has a history of hypertension. Has not been taking his blood pressure medications. We'll give him a prescription for his home blood pressure medications again.  Strict return precautions provided. Gave him prescriptions as above. Reiterated that he will need to follow-up with his primary care doctor for multiple reasons.  Patient discharged in stable condition.  Lindalou Hose, MD 10/28/15 1610  Lavera Guise, MD 10/29/15 [phone]

## 2015-10-28 NOTE — Discharge Instructions (Signed)
Take the inhaler, prednisone, doxycycline for your infection.  Take the potassium to help replace your low potassium here. Have your primary care doctor recheck your potassium.  Call the attached number to establish care with a cardiologist, as you might have heart failure.  Take the metoprolol, Lasix/furosemide for your high blood pressure.  [employer] 211 is a great source of information about community services available.  Access by dialing 2-1-1 from anywhere in [place], or by website -  PooledIncome.pl.   Other Local Resources (Updated 05/2015)  Financial Assistance   Services    Phone Number and Address  [facility]  Low-cost medical care - 1st and 3rd Saturday of every month  Must not qualify for public or private insurance and must have limited income [phone] [address] [place], [place]    [place] [employer] of Social Services  Child care  Emergency assistance for housing and [employer]  Medicaid [phone] [address] [place], [place] [address]   [facility]  Low-cost medical care for children, communicable diseases, sexually-transmitted diseases, immunizations, maternity care, womens health and family planning [phone] [address] [place], [place] [address]  [facility]   Medication assistance for [facility] residents  Must meet income requirements [phone] [address] [place], [place].    [facility] Social Services  Child care  Emergency assistance for housing and [employer]  Medicaid [phone] [address] [place], [place] [address]  [facility]   Low-cost medical care,   Monday through Friday, 9 am to 6 pm.   Accepts Medicare/Medicaid, and self-pay [phone] [address] [place], [place] [address]  [facility]  Low-cost medical care - Monday through Friday, 8:30 am - 5:30 pm  Accepts Medicaid and self-pay [phone] [address] [place], [place] [address]   [facility]  Primary medical care, including for those with sickle cell disease  Accepts Medicare, Medicaid, insurance and self-pay [phone] [address] [place], [place]  [facility]   Primary medical care  Accepts Medicare, [place], insurance and self-pay [phone] [address] [place], [place] [address]   [facility] Department of Social Services  Child care  Emergency assistance for housing and [employer]  Medicaid [phone] [address] [place], [place] [address]  [facility] Department of Health and [employer]  Child care  Emergency assistance for housing and [employer]  Medicaid [phone] [address] [place], [place] [address]   [facility] Medication Assistance Program  Medication assistance for [facility] residents with no insurance only  Must have a primary care doctor [phone] [place], [place]  [facility]   Primary medical care  [place], [place], insurance  [phone] [address] [place], [place]  [facility]   Medication assistance [phone]  [facility]   Primary medical care  Accepts Medicare, [place], insurance and self-pay [phone] [address] [place], [place] [address]  [facility]   Primary medical care  Accepts Medicare, [place], insurance and self-pay [phone] [address] [place], [place] [address]  [facility]  For [place] residents between the ages of 66 and 32 who do not have any form of health insurance, [place],  Medicaid, or VA benefits.   Services are provided free of charge to uninsured patients who fall within federal poverty guidelines.    Hours: Tuesdays and Thursdays, 4:15 - 8 pm [phone] [address] [place], [place] [address]  [facility]     Primary medical care  Dental care  Nutritional counseling  Pharmacy  Accepts Medicaid, Medicare, most insurance.  Fees are adjusted based on ability to pay.   [phone] [facility] [address] [place], [place]  [phone] Ph[facility] Real[facility]les [facility] [address] [place], [place]  [phone] [facility] [place], [place]  [phone] [facility], [address] [place], [place]  [phone] [facility] [address] [place], [place]  [facility]  Womens health and family planning [phone] [place], [place]  [facility] Department of Social Services  Child care  Emergency assistance for housing and [employer]ark[employer]ilities  Food stamps  Medicaid [phone] [address], [place], [place] [address]   [facility]    Ages 7818 and older  Hours: Mondays and Thursdays, 7:00 am - 9:00 am Patients are seen on a first come, first served basis. [phone], [phone] [place], [place]  [facility] of Social Services  Child care  Emergency assistance for housing and [employer]ly[employer]arkutilities  Food stamps  Medicaid [phone] [place], [place] [address]  [employer]  Medication assistance  Rental assistance  Food pantry  Medication assistance  Housing assistance  Emergency food distribution  Utility assistance [phone] [address] [place], [place]  [phone]  [address] [place], [place] [address] Hours: Tuesdays and Thursdays from 9am - 12 noon by appointment only  [phone] [address] [place], [place] [address]  [facility] - Lanae Boastlara F. Gunn   Accepts private insurance, [place]are, and [place].  Payment is based on a sliding scale for those without insurance.  Hours: Mondays, Tuesdays and Thursdays, 8:30 am - 5:30 pm.   [phone] [address] [place], [place]  [facility] - Family Medicine at [facility]    Accepts private insurance, [place], and [place].  Payment is based on a sliding scale for those without insurance. [phone] [address] [place], [place]  [facility] - Pediatrics at [address]. Scientist, research (physical sciences)[place]  Accepts private insurance, [employer]Medicare, and [place]icaid.  Payment is based on a sliding scale for those without insurance [phone] [address], [place], [place]  [facility] - Pediatrics at [place]  Accepts private insurance, [employer]ixMedicare, and [place].  Payment is based on a sliding scale for those without insurance. [phone] [address] [place], [place]  [facility] - [facility]s at [facility]  Accepts private insurance, [place], and [place].  Payment is based on a sliding scale for those without insurance. [phone], [phone] [place], [place].    [facility]  Maternity care.  Accepts Medicaid and self-pay. [phone] [address] [place], [place]

## 2015-10-29 NOTE — ED Provider Notes (Signed)
I saw and evaluated the patient, reviewed the resident's note and I agree with the findings and plan.   EKG Interpretation None      60 year old male who presents with 4 weeks productive cough of clear sputum a/w sinus congestion. History of HTN with non-compliance of medications. No fever, chest pain, shortness of breath. No orthopnea, but notes that he wakes up in the middle of the night coughing. Occasional pedal edema, but denies current edema. No DOE but notes that for a long time now dyspnea with playing basketball. Well appearing, afebrile, on room air comfortably breathing. Does not appear fluid overloaded. Lungs sounds clear. Basic blood work overall unremarkable. CXR without infiltrate but ? Pulmonary vascular congestion. Given 4 weeks of symptoms, will treat with course of antibiotics with ? Of atypical pneumonia. Will also restart on BP medications and low dose lasix for ? Of early signs of heart failure. Given follow-up with cardiology for ECHO as further w/u as needed. Strict return and follow-up instructions reviewed. He expressed understanding of all discharge instructions and felt comfortable with the plan of care.   Lavera Guiseana Duo Emrys Mceachron, MD 10/29/15 [phone]

## 2016-10-17 ENCOUNTER — Encounter (HOSPITAL_COMMUNITY): Payer: Self-pay | Admitting: Emergency Medicine

## 2016-10-17 ENCOUNTER — Emergency Department (HOSPITAL_COMMUNITY): Payer: Self-pay

## 2016-10-17 ENCOUNTER — Inpatient Hospital Stay (HOSPITAL_COMMUNITY)
Admission: EM | Admit: 2016-10-17 | Discharge: 2016-10-19 | DRG: 282 | Disposition: A | Discharge status: Home / Self Care | Payer: Self-pay | Attending: Internal Medicine | Admitting: Internal Medicine

## 2016-10-17 DIAGNOSIS — I214 Non-ST elevation (NSTEMI) myocardial infarction: Principal | ICD-10-CM

## 2016-10-17 DIAGNOSIS — G20A1 Parkinson's disease without dyskinesia, without mention of fluctuations: Secondary | ICD-10-CM | POA: Diagnosis present

## 2016-10-17 DIAGNOSIS — R7301 Impaired fasting glucose: Secondary | ICD-10-CM | POA: Diagnosis present

## 2016-10-17 DIAGNOSIS — E876 Hypokalemia: Secondary | ICD-10-CM | POA: Diagnosis present

## 2016-10-17 DIAGNOSIS — R079 Chest pain, unspecified: Secondary | ICD-10-CM | POA: Diagnosis present

## 2016-10-17 DIAGNOSIS — I160 Hypertensive urgency: Secondary | ICD-10-CM | POA: Diagnosis present

## 2016-10-17 DIAGNOSIS — G20 Parkinson's disease: Secondary | ICD-10-CM | POA: Diagnosis present

## 2016-10-17 DIAGNOSIS — I10 Essential (primary) hypertension: Secondary | ICD-10-CM

## 2016-10-17 DIAGNOSIS — R251 Tremor, unspecified: Secondary | ICD-10-CM

## 2016-10-17 DIAGNOSIS — R0600 Dyspnea, unspecified: Secondary | ICD-10-CM | POA: Diagnosis present

## 2016-10-17 DIAGNOSIS — Z8249 Family history of ischemic heart disease and other diseases of the circulatory system: Secondary | ICD-10-CM

## 2016-10-17 DIAGNOSIS — I2510 Atherosclerotic heart disease of native coronary artery without angina pectoris: Secondary | ICD-10-CM | POA: Diagnosis present

## 2016-10-17 HISTORY — DX: Parkinson's disease without dyskinesia, without mention of fluctuations: G20.A1

## 2016-10-17 HISTORY — DX: Atherosclerotic heart disease of native coronary artery without angina pectoris: I25.10

## 2016-10-17 HISTORY — DX: Parkinson's disease: G20

## 2016-10-17 LAB — BASIC METABOLIC PANEL
ANION GAP: 9 (ref 5–15)
BUN: 15 mg/dL (ref 6–20)
CO2: 26 mmol/L (ref 22–32)
CREATININE: 1.13 mg/dL (ref 0.61–1.24)
Calcium: 9.1 mg/dL (ref 8.9–10.3)
Chloride: 104 mmol/L (ref 101–111)
GFR calc Af Amer: >60 mL/min (ref >60)
GLUCOSE: 105 mg/dL — AB (ref 65–99)
Potassium: 3.1 mmol/L — ABNORMAL LOW (ref 3.5–5.1)
SODIUM: 139 mmol/L (ref 135–145)

## 2016-10-17 LAB — CBC
HEMATOCRIT: 41.3 % (ref 39.0–52.0)
Hemoglobin: 14.3 g/dL (ref 13.0–17.0)
MCH: 30.2 pg (ref 26.0–34.0)
MCHC: 34.6 g/dL (ref 30.0–36.0)
MCV: 87.1 fL (ref 78.0–100.0)
PLATELETS: 248 10*3/uL (ref 150–400)
RBC: 4.74 MIL/uL (ref 4.22–5.81)
RDW: 13 % (ref 11.5–15.5)
WBC: 6.2 10*3/uL (ref 4.0–10.5)

## 2016-10-17 LAB — I-STAT TROPONIN, ED
TROPONIN I, POC: 0.32 ng/mL — AB (ref 0.00–0.08)
Troponin i, poc: 0.05 ng/mL (ref 0.00–0.08)

## 2016-10-17 MED ORDER — HEPARIN BOLUS VIA INFUSION
4000.0000 [IU] | Freq: Once | INTRAVENOUS | Status: AC
  Administered 2016-10-17: 4000 [IU] via INTRAVENOUS
  Filled 2016-10-17: qty 4000

## 2016-10-17 MED ORDER — ASPIRIN 81 MG PO CHEW
324.0000 mg | CHEWABLE_TABLET | Freq: Once | ORAL | Status: AC
  Administered 2016-10-17: 324 mg via ORAL
  Filled 2016-10-17: qty 4

## 2016-10-17 MED ORDER — HEPARIN (PORCINE) IN NACL 100-0.45 UNIT/ML-% IJ SOLN
1350.0000 [IU]/h | INTRAMUSCULAR | Status: DC
  Administered 2016-10-17: 1350 [IU]/h via INTRAVENOUS
  Filled 2016-10-17: qty 250

## 2016-10-17 NOTE — Progress Notes (Signed)
ANTICOAGULATION CONSULT NOTE - Initial Consult  Pharmacy Consult for Heparin Indication: chest pain/ACS  No Known Allergies  Patient Measurements: Height: 5\' 8"  (172.7 cm) Weight: 273 lb (123.8 kg) IBW/kg (Calculated) : 68.4 Heparin Dosing Weight: 97 kg  Vital Signs: Temp: 99.2 F (37.3 C) (05/21 1745) Temp Source: Oral (05/21 1745) BP: 138/99 (05/21 2200) Pulse Rate: 93 (05/21 2200)  Labs:  Recent Labs  10/17/16 1604  HGB 14.3  HCT 41.3  PLT 248  CREATININE 1.13    Estimated Creatinine Clearance: 90.2 mL/min (by C-G formula based on SCr of 1.13 mg/dL).   Medical History: Past Medical History:  Diagnosis Date  . Hypertension   . Nosebleed     Medications:  See electronic med rec  Assessment: 61 y.o. M presents with CP. To begin heparin for r/o ACS. No AC PTA. CBC ok at baseline.  Goal of Therapy:  Heparin level 0.3-0.7 units/ml Monitor platelets by anticoagulation protocol: Yes   Plan:  Heparin IV bolus 4000 units Heparin gtt at 1350 units/hr Will f/u heparin level in 6 hours Daily heparin level and CBC  Jason Roberson, PharmD, BCPS Clinical pharmacist, pager [phone] 10/17/2016,11:23 PM

## 2016-10-17 NOTE — ED Triage Notes (Addendum)
Pt to ED from home c/o R sided, non-radiating CP starting this morning. Currently rates it 4/10 with some SOB and dizziness. Patient states it's never happened before, hx of only HTN. Pt A&O x 4, resp e/u, skin warm/dry. Patient adds that his hands are very shaky and doesn't know why, denies feeling anxious.

## 2016-10-18 ENCOUNTER — Inpatient Hospital Stay (HOSPITAL_COMMUNITY): Payer: Self-pay

## 2016-10-18 ENCOUNTER — Inpatient Hospital Stay (HOSPITAL_COMMUNITY)
Admission: EM | Disposition: A | Discharge status: Home / Self Care | Payer: Self-pay | Source: Home / Self Care | Attending: Internal Medicine

## 2016-10-18 DIAGNOSIS — R072 Precordial pain: Secondary | ICD-10-CM

## 2016-10-18 DIAGNOSIS — R0609 Other forms of dyspnea: Secondary | ICD-10-CM

## 2016-10-18 DIAGNOSIS — I200 Unstable angina: Secondary | ICD-10-CM

## 2016-10-18 DIAGNOSIS — I214 Non-ST elevation (NSTEMI) myocardial infarction: Secondary | ICD-10-CM

## 2016-10-18 DIAGNOSIS — R0600 Dyspnea, unspecified: Secondary | ICD-10-CM | POA: Diagnosis present

## 2016-10-18 DIAGNOSIS — I2510 Atherosclerotic heart disease of native coronary artery without angina pectoris: Secondary | ICD-10-CM

## 2016-10-18 DIAGNOSIS — I10 Essential (primary) hypertension: Secondary | ICD-10-CM

## 2016-10-18 DIAGNOSIS — R251 Tremor, unspecified: Secondary | ICD-10-CM

## 2016-10-18 DIAGNOSIS — R079 Chest pain, unspecified: Secondary | ICD-10-CM | POA: Diagnosis present

## 2016-10-18 HISTORY — PX: LEFT HEART CATH AND CORONARY ANGIOGRAPHY: CATH118249

## 2016-10-18 LAB — MRSA PCR SCREENING: MRSA by PCR: NEGATIVE

## 2016-10-18 LAB — BASIC METABOLIC PANEL
ANION GAP: 7 (ref 5–15)
BUN: 11 mg/dL (ref 6–20)
CALCIUM: 8.8 mg/dL — AB (ref 8.9–10.3)
CO2: 26 mmol/L (ref 22–32)
CREATININE: 0.97 mg/dL (ref 0.61–1.24)
Chloride: 104 mmol/L (ref 101–111)
GLUCOSE: 102 mg/dL — AB (ref 65–99)
Potassium: 3 mmol/L — ABNORMAL LOW (ref 3.5–5.1)
Sodium: 137 mmol/L (ref 135–145)

## 2016-10-18 LAB — CBC
HCT: 40.9 % (ref 39.0–52.0)
HCT: 41.4 % (ref 39.0–52.0)
HEMOGLOBIN: 14 g/dL (ref 13.0–17.0)
Hemoglobin: 13.7 g/dL (ref 13.0–17.0)
MCH: 29.2 pg (ref 26.0–34.0)
MCH: 30.4 pg (ref 26.0–34.0)
MCHC: 33.1 g/dL (ref 30.0–36.0)
MCHC: 34.2 g/dL (ref 30.0–36.0)
MCV: 88.3 fL (ref 78.0–100.0)
MCV: 88.7 fL (ref 78.0–100.0)
PLATELETS: 234 10*3/uL (ref 150–400)
Platelets: 225 10*3/uL (ref 150–400)
RBC: 4.61 MIL/uL (ref 4.22–5.81)
RBC: 4.69 MIL/uL (ref 4.22–5.81)
RDW: 13 % (ref 11.5–15.5)
RDW: 13.3 % (ref 11.5–15.5)
WBC: 6.6 10*3/uL (ref 4.0–10.5)
WBC: 7.2 10*3/uL (ref 4.0–10.5)

## 2016-10-18 LAB — PROTIME-INR
INR: 1.13
Prothrombin Time: 14.6 seconds (ref 11.4–15.2)

## 2016-10-18 LAB — TROPONIN I
TROPONIN I: 0.15 ng/mL — AB (ref <0.03)
TROPONIN I: 0.08 ng/mL — AB (ref <0.03)
Troponin I: 0.35 ng/mL (ref <0.03)

## 2016-10-18 LAB — CREATININE, SERUM
CREATININE: 0.98 mg/dL (ref 0.61–1.24)
GFR calc non Af Amer: >60 mL/min (ref >60)

## 2016-10-18 LAB — ECHOCARDIOGRAM COMPLETE
Height: 68 in
WEIGHTICAEL: 4307.2 [oz_av]

## 2016-10-18 LAB — LIPID PANEL
CHOL/HDL RATIO: 3.2 ratio
CHOLESTEROL: 133 mg/dL (ref 0–200)
HDL: 42 mg/dL (ref >40)
LDL Cholesterol: 83 mg/dL (ref 0–99)
TRIGLYCERIDES: 40 mg/dL (ref <150)
VLDL: 8 mg/dL (ref 0–40)

## 2016-10-18 LAB — HIV ANTIBODY (ROUTINE TESTING W REFLEX): HIV Screen 4th Generation wRfx: NONREACTIVE

## 2016-10-18 LAB — HEPARIN LEVEL (UNFRACTIONATED): HEPARIN UNFRACTIONATED: 0.44 [IU]/mL (ref 0.30–0.70)

## 2016-10-18 SURGERY — LEFT HEART CATH AND CORONARY ANGIOGRAPHY
Anesthesia: LOCAL

## 2016-10-18 MED ORDER — ONDANSETRON HCL 4 MG/2ML IJ SOLN: 4.0000 mg | Freq: Four times a day (QID) | INTRAMUSCULAR | Status: DC | PRN

## 2016-10-18 MED ORDER — SODIUM CHLORIDE 0.9% FLUSH: 3.0000 mL | Freq: Two times a day (BID) | INTRAVENOUS | Status: DC

## 2016-10-18 MED ORDER — AMLODIPINE BESYLATE 5 MG PO TABS
5.0000 mg | ORAL_TABLET | Freq: Every day | ORAL | Status: DC
  Administered 2016-10-18: 5 mg via ORAL
  Filled 2016-10-18: qty 1

## 2016-10-18 MED ORDER — AMLODIPINE BESYLATE 10 MG PO TABS
10.0000 mg | ORAL_TABLET | Freq: Every day | ORAL | Status: DC
  Administered 2016-10-19: 10 mg via ORAL
  Filled 2016-10-18: qty 1

## 2016-10-18 MED ORDER — SODIUM CHLORIDE 0.9 % IV SOLN: INTRAVENOUS | Status: AC

## 2016-10-18 MED ORDER — ASPIRIN EC 81 MG PO TBEC: 81.0000 mg | DELAYED_RELEASE_TABLET | Freq: Every day | ORAL | Status: DC

## 2016-10-18 MED ORDER — LIDOCAINE HCL (PF) 1 % IJ SOLN
INTRAMUSCULAR | Status: AC
  Filled 2016-10-18: qty 30

## 2016-10-18 MED ORDER — ATORVASTATIN CALCIUM 80 MG PO TABS: 80.0000 mg | ORAL_TABLET | Freq: Every day | ORAL | Status: DC

## 2016-10-18 MED ORDER — HEPARIN (PORCINE) IN NACL 2-0.9 UNIT/ML-% IJ SOLN
INTRAMUSCULAR | Status: AC
  Filled 2016-10-18: qty 1000

## 2016-10-18 MED ORDER — ENOXAPARIN SODIUM 40 MG/0.4ML ~~LOC~~ SOLN
40.0000 mg | SUBCUTANEOUS | Status: DC
  Administered 2016-10-19: 40 mg via SUBCUTANEOUS
  Filled 2016-10-18: qty 0.4

## 2016-10-18 MED ORDER — SODIUM CHLORIDE 0.9% FLUSH
3.0000 mL | Freq: Two times a day (BID) | INTRAVENOUS | Status: DC
  Administered 2016-10-19: 3 mL via INTRAVENOUS

## 2016-10-18 MED ORDER — VERAPAMIL HCL 2.5 MG/ML IV SOLN
INTRAVENOUS | Status: DC | PRN
  Administered 2016-10-18: 15:00:00 via INTRA_ARTERIAL

## 2016-10-18 MED ORDER — POTASSIUM CHLORIDE CRYS ER 20 MEQ PO TBCR: 40.0000 meq | EXTENDED_RELEASE_TABLET | Freq: Once | ORAL | Status: DC

## 2016-10-18 MED ORDER — HEPARIN SODIUM (PORCINE) 1000 UNIT/ML IJ SOLN
INTRAMUSCULAR | Status: AC
Start: 2016-10-18 — End: 2016-10-18
  Filled 2016-10-18: qty 1

## 2016-10-18 MED ORDER — BENZONATATE 100 MG PO CAPS
100.0000 mg | ORAL_CAPSULE | Freq: Three times a day (TID) | ORAL | Status: DC | PRN
  Administered 2016-10-18: 100 mg via ORAL
  Filled 2016-10-18: qty 1

## 2016-10-18 MED ORDER — SODIUM CHLORIDE 0.9% FLUSH: 3.0000 mL | INTRAVENOUS | Status: DC | PRN

## 2016-10-18 MED ORDER — POTASSIUM CHLORIDE CRYS ER 10 MEQ PO TBCR
40.0000 meq | EXTENDED_RELEASE_TABLET | Freq: Once | ORAL | Status: AC
  Administered 2016-10-18: 40 meq via ORAL
  Filled 2016-10-18: qty 4

## 2016-10-18 MED ORDER — ACETAMINOPHEN 325 MG PO TABS: 650.0000 mg | ORAL_TABLET | ORAL | Status: DC | PRN

## 2016-10-18 MED ORDER — NITROGLYCERIN 0.4 MG SL SUBL: 0.4000 mg | SUBLINGUAL_TABLET | SUBLINGUAL | Status: DC | PRN

## 2016-10-18 MED ORDER — ASPIRIN 81 MG PO CHEW
81.0000 mg | CHEWABLE_TABLET | ORAL | Status: AC
  Administered 2016-10-18: 81 mg via ORAL
  Filled 2016-10-18: qty 1

## 2016-10-18 MED ORDER — MIDAZOLAM HCL 2 MG/2ML IJ SOLN
INTRAMUSCULAR | Status: AC
  Filled 2016-10-18: qty 2

## 2016-10-18 MED ORDER — POTASSIUM CHLORIDE CRYS ER 20 MEQ PO TBCR
40.0000 meq | EXTENDED_RELEASE_TABLET | Freq: Once | ORAL | Status: AC
  Administered 2016-10-18: 40 meq via ORAL
  Filled 2016-10-18: qty 2

## 2016-10-18 MED ORDER — SODIUM CHLORIDE 0.9 % IV SOLN: 250.0000 mL | INTRAVENOUS | Status: DC | PRN

## 2016-10-18 MED ORDER — SODIUM CHLORIDE 0.9 % WEIGHT BASED INFUSION: 3.0000 mL/kg/h | INTRAVENOUS | Status: DC

## 2016-10-18 MED ORDER — ATORVASTATIN CALCIUM 80 MG PO TABS
80.0000 mg | ORAL_TABLET | Freq: Every day | ORAL | Status: DC
  Administered 2016-10-18: 80 mg via ORAL
  Filled 2016-10-18: qty 1

## 2016-10-18 MED ORDER — HEPARIN (PORCINE) IN NACL 2-0.9 UNIT/ML-% IJ SOLN
INTRAMUSCULAR | Status: AC | PRN
  Administered 2016-10-18: 1000 mL

## 2016-10-18 MED ORDER — FENTANYL CITRATE (PF) 100 MCG/2ML IJ SOLN
INTRAMUSCULAR | Status: AC
  Filled 2016-10-18: qty 2

## 2016-10-18 MED ORDER — AMLODIPINE BESYLATE 5 MG PO TABS
5.0000 mg | ORAL_TABLET | ORAL | Status: AC
  Administered 2016-10-18: 5 mg via ORAL
  Filled 2016-10-18: qty 1

## 2016-10-18 MED ORDER — ASPIRIN 81 MG PO CHEW
81.0000 mg | CHEWABLE_TABLET | Freq: Every day | ORAL | Status: DC
  Administered 2016-10-19: 81 mg via ORAL
  Filled 2016-10-18: qty 1

## 2016-10-18 MED ORDER — FENTANYL CITRATE (PF) 100 MCG/2ML IJ SOLN
INTRAMUSCULAR | Status: DC | PRN
  Administered 2016-10-18: 50 ug via INTRAVENOUS

## 2016-10-18 MED ORDER — VERAPAMIL HCL 2.5 MG/ML IV SOLN
INTRAVENOUS | Status: AC
  Filled 2016-10-18: qty 2

## 2016-10-18 MED ORDER — IOPAMIDOL (ISOVUE-370) INJECTION 76%
INTRAVENOUS | Status: AC
  Filled 2016-10-18: qty 100

## 2016-10-18 MED ORDER — SODIUM CHLORIDE 0.9 % WEIGHT BASED INFUSION
1.0000 mL/kg/h | INTRAVENOUS | Status: DC
  Administered 2016-10-18: 1 mL/kg/h via INTRAVENOUS

## 2016-10-18 MED ORDER — LIDOCAINE HCL (PF) 1 % IJ SOLN
INTRAMUSCULAR | Status: DC | PRN
  Administered 2016-10-18: 2 mL

## 2016-10-18 MED ORDER — MIDAZOLAM HCL 2 MG/2ML IJ SOLN
INTRAMUSCULAR | Status: DC | PRN
  Administered 2016-10-18: 1 mg via INTRAVENOUS

## 2016-10-18 MED ORDER — OXYCODONE-ACETAMINOPHEN 5-325 MG PO TABS: 1.0000 | ORAL_TABLET | ORAL | Status: DC | PRN

## 2016-10-18 MED ORDER — IOPAMIDOL (ISOVUE-370) INJECTION 76%
INTRAVENOUS | Status: DC | PRN
  Administered 2016-10-18: 90 mL via INTRAVENOUS

## 2016-10-18 MED ORDER — CARVEDILOL 6.25 MG PO TABS
6.2500 mg | ORAL_TABLET | Freq: Two times a day (BID) | ORAL | Status: DC
  Administered 2016-10-18 – 2016-10-19 (×2): 6.25 mg via ORAL
  Filled 2016-10-18 (×2): qty 1

## 2016-10-18 MED ORDER — CARBIDOPA-LEVODOPA 25-100 MG PO TABS
1.0000 | ORAL_TABLET | Freq: Three times a day (TID) | ORAL | Status: DC
  Administered 2016-10-18 – 2016-10-19 (×2): 1 via ORAL
  Filled 2016-10-18 (×4): qty 1

## 2016-10-18 SURGICAL SUPPLY — 13 items
CATH INFINITI 5 FR JL3.5 (CATHETERS) ×4 IMPLANT
CATH INFINITI JR4 5F (CATHETERS) ×2 IMPLANT
COVER PRB 48X5XTLSCP FOLD TPE (BAG) ×1 IMPLANT
COVER PROBE 5X48 (BAG) ×1
DEVICE RAD COMP TR BAND LRG (VASCULAR PRODUCTS) ×2 IMPLANT
GLIDESHEATH SLEND A-KIT 6F 22G (SHEATH) ×2 IMPLANT
GLIDESHEATH SLEND SS 6F .021 (SHEATH) ×2 IMPLANT
GUIDEWIRE INQWIRE 1.5J.035X260 (WIRE) ×1 IMPLANT
INQWIRE 1.5J .035X260CM (WIRE) ×2
KIT HEART LEFT (KITS) ×2 IMPLANT
PACK CARDIAC CATHETERIZATION (CUSTOM PROCEDURE TRAY) ×2 IMPLANT
TRANSDUCER W/STOPCOCK (MISCELLANEOUS) ×2 IMPLANT
TUBING CIL FLEX 10 FLL-RA (TUBING) ×2 IMPLANT

## 2016-10-18 NOTE — Progress Notes (Signed)
ANTICOAGULATION CONSULT NOTE   Pharmacy Consult for Heparin Indication: chest pain/ACS  No Known Allergies  Patient Measurements: Height: 5\' 8"  (172.7 cm) Weight: 269 lb 3.2 oz (122.1 kg) IBW/kg (Calculated) : 68.4 Heparin Dosing Weight: 97 kg  Vital Signs: Temp: 98 F (36.7 C) (05/22 1137) Temp Source: Oral (05/22 1137) BP: 148/97 (05/22 1137) Pulse Rate: 88 (05/22 1137)  Labs:  Recent Labs  10/17/16 1604 10/17/16 2333 10/18/16 0000 10/18/16 0002 10/18/16 0441 10/18/16 1027  HGB 14.3  --   --  13.7  --   --   HCT 41.3  --   --  41.4  --   --   PLT 248  --   --  234  --   --   LABPROT  --   --   --   --   --  14.6  INR  --   --   --   --   --  1.13  HEPARINUNFRC  --   --  <0.10*  --   --  0.44  CREATININE 1.13  --   --   --   --  0.97  TROPONINI  --  0.35*  --   --  0.15*  --     Estimated Creatinine Clearance: 104.3 mL/min (by C-G formula based on SCr of 0.97 mg/dL).   Medical History: Past Medical History:  Diagnosis Date  . Hypertension   . Nosebleed     Medications:  See electronic med rec  Assessment: 61 y.o. M presents with CP. To begin heparin for r/o ACS. No AC PTA. CBC stable overnight, no bleeding issues noted.  Heparin level this morning was delayed but finally resulted within therapeutic range at 0.44 on 1350 units/hr.  Plan for LHC at some point.  Goal of Therapy:  Heparin level 0.3-0.7 units/ml Monitor platelets by anticoagulation protocol: Yes   Plan:  Continue heparin gtt at 1350 units/hr Will f/u heparin level in 6 hours to confirm Daily heparin level and CBC  Sheppard CoilFrank Wilson PharmD., BCPS Clinical Pharmacist Pager [phone] 10/18/2016 11:50 AM

## 2016-10-18 NOTE — Progress Notes (Signed)
Briefly seen this am. Mr. Ellenwood was admitted for acute onset sharp chest pain. He says he has never seen a doctor. Not aware if his parents had CAD. Troponin initially 0.05, then 0.32, 0.35, 0.15 - on heparin. Now says he "feels like a million bucks". On exam, noted to have a bilateral hand tremor - pill rolling, says he shuffles his feet. Symptoms concerning for possible Parkinson's, versus essential tremor - he does stutter some, or ?vocal tremor. Asked Dr. Kirkpatrick with neuro to evaluate. With regards to chest pain, concern for NSTEMI. Recommend LHC today - discussed the risks, benefits and alternatives and he is agreeable to proceed.  Kenneth C. Hilty, MD, FACC  [place]  [facility]  Attending Cardiologist  Direct Dial: [phone]  Fax: [phone]  Website:  [email or web address]   

## 2016-10-18 NOTE — H&P (View-Only) (Signed)
Briefly seen this am. Mr. Manson PasseyBrown was admitted for acute onset sharp chest pain. He says he has never seen a doctor. Not aware if his parents had CAD. Troponin initially 0.05, then 0.32, 0.35, 0.15 - on heparin. Now says he "feels like a million bucks". On exam, noted to have a bilateral hand tremor - pill rolling, says he shuffles his feet. Symptoms concerning for possible Parkinson's, versus essential tremor - he does stutter some, or ?vocal tremor. Asked Dr. Amada JupiterKirkpatrick with neuro to evaluate. With regards to chest pain, concern for NSTEMI. Recommend LHC today - discussed the risks, benefits and alternatives and he is agreeable to proceed.  Chrystie NoseKenneth C. Korene Dula, MD, [facility]  [place]  [facility]  Attending Cardiologist  Direct Dial: [phone]  Fax: [phone]  Website:  [email or web address]

## 2016-10-18 NOTE — Plan of Care (Signed)
Problem: Physical Regulation: Goal: Ability to maintain clinical measurements within normal limits will improve Outcome: Progressing Pt is currently chest pain free and with stable vital signs

## 2016-10-18 NOTE — Progress Notes (Signed)
Cath showed branch disease - too small to intervene upon. Normal EF 60-65% with normal wall motion on echo. Mild to moderate MR. Needs better BP control. Will increase meds. Discussed dietary changes - he appears to be prediabetic with a fasting glucose of 105. Start carvedilol 6.25 mg BID. 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 Appreciate their suggestions. Re-evaluate BP in the am tomorrow. Likely discharge home tomorrow am if improved.  Chrystie NoseKenneth C. Hilty, MD, [facility]  [place]  [facility]  Attending Cardiologist  Direct Dial: [phone]  Fax: [phone]  Website:  [email or web address]

## 2016-10-18 NOTE — ED Provider Notes (Signed)
[facility]-EMERGENCY DEPT Provider Note   CSN: [id number] Arrival date & time: 10/17/16  1552     History   Chief Complaint Chief Complaint  Patient presents with  . Chest Pain    HPI Jason Roberson is a 61 y.o. male.  HPI  Pt is a 61 y/o male who comes in to the ER with cc of chest pain. Pt reports that around 1:30 pm he had sudden onset severe chest pain that was sharp and midsternal, radiating to both sides of his chest. The chest pain lasted for an hour and a half, and resolved on it's own. Pt had associated shortness of breath and dizziness. Pt has no hx of CAD. He has hx of HTN and hasnt taken his meds. Pt denies any drug use or heavy smoking.  Pt also c/o tremors on the L arm going on for 3 months. The tremor is present at rest, and resolves with any movement. He has no neck pain or trauma. No meds.  Past Medical History:  Diagnosis Date  . Hypertension   . Nosebleed     Patient Active Problem List   Diagnosis Date Noted  . Epistaxis 09/26/2013  . Syncope 09/26/2013    History reviewed. No pertinent surgical history.     Home Medications    Prior to Admission medications   Medication Sig Start Date End Date Taking? Authorizing Provider  acetaminophen (TYLENOL) 325 MG tablet Take 2 tablets (650 mg total) by mouth every 6 (six) hours as needed for mild pain (or Fever >/= 101). Patient not taking: Reported on 10/17/2016 09/27/13   Esperanza SheetsBuriev, Ulugbek N, MD  albuterol (PROVENTIL HFA;VENTOLIN HFA) 108 (90 Base) MCG/ACT inhaler Inhale 2 puffs into the lungs every 4 (four) hours as needed for wheezing or shortness of breath. Patient not taking: Reported on 10/17/2016 10/28/15   Lindalou Hose'Rourke, Sean, MD  amLODipine (NORVASC) 5 MG tablet Take 1 tablet (5 mg total) by mouth daily. Patient not taking: Reported on 10/17/2016 05/14/14   Azalia Bilisampos, Kevin, MD  doxycycline (VIBRAMYCIN) 100 MG capsule Take 1 capsule (100 mg total) by mouth 2 (two) times daily. Patient not taking: Reported on  10/17/2016 10/28/15   Lindalou Hose'Rourke, Sean, MD  furosemide (LASIX) 20 MG tablet Take 1 tablet (20 mg total) by mouth daily. Patient not taking: Reported on 10/17/2016 10/28/15   Lindalou Hose'Rourke, Sean, MD  meclizine (ANTIVERT) 25 MG tablet Take 1 tablet (25 mg total) by mouth 3 (three) times daily as needed for dizziness. Patient not taking: Reported on 10/17/2016 06/30/14   Elpidio AnisUpstill, Shari, PA-C  metoprolol (LOPRESSOR) 50 MG tablet Take 0.5 tablets (25 mg total) by mouth 2 (two) times daily. Patient not taking: Reported on 10/17/2016 10/28/15   Lindalou Hose'Rourke, Sean, MD  ondansetron (ZOFRAN) 4 MG tablet Take 1 tablet (4 mg total) by mouth every 6 (six) hours. Patient not taking: Reported on 10/17/2016 06/30/14   Elpidio AnisUpstill, Shari, PA-C  potassium chloride (K-DUR) 10 MEQ tablet Take 1 tablet (10 mEq total) by mouth daily. Patient not taking: Reported on 10/17/2016 10/28/15   Lindalou Hose'Rourke, Sean, MD  predniSONE (DELTASONE) 20 MG tablet Take 3 tablets (60 mg total) by mouth daily with breakfast. Patient not taking: Reported on 10/17/2016 10/28/15   Lindalou Hose'Rourke, Sean, MD    Family History No family history on file.  Social History Social History  Substance Use Topics  . Smoking status: Never Smoker  . Smokeless tobacco: Never Used  . Alcohol use No     Allergies   Patient has  no known allergies.   Review of Systems Review of Systems  Constitutional: Negative for activity change, chills and fever.  HENT: Negative for congestion.   Respiratory: Positive for chest tightness and shortness of breath. Negative for cough.   Cardiovascular: Positive for chest pain.  Gastrointestinal: Negative for abdominal distention.  Genitourinary: Negative for dysuria.  Musculoskeletal: Positive for back pain. Negative for arthralgias and neck pain.  Allergic/Immunologic: Negative for immunocompromised state.  Neurological: Positive for dizziness. Negative for light-headedness and headaches.  Psychiatric/Behavioral: Negative for confusion.      Physical Exam Updated Vital Signs BP 134/85   Pulse 95   Temp 99.2 F (37.3 C) (Oral)   Resp 19   Ht 5\' 8"  (1.727 m)   Wt 123.8 kg (273 lb)   SpO2 98%   BMI 41.51 kg/m   Physical Exam  Constitutional: He is oriented to person, place, and time. He appears well-developed and well-nourished.  HENT:  Head: Normocephalic and atraumatic.  Eyes: EOM are normal. Pupils are equal, round, and reactive to light.  Neck: Normal range of motion. Neck supple. No JVD present.  Cardiovascular: Normal rate and regular rhythm.   Pulmonary/Chest: Effort normal and breath sounds normal. No respiratory distress. He has no wheezes.  Abdominal: Soft. Bowel sounds are normal. He exhibits no distension. There is no tenderness. There is no rebound and no guarding.  Neurological: He is alert and oriented to person, place, and time. No cranial nerve deficit. Coordination normal.  NIHSS - 0 No objective sensory deficits, Motor strength upper and lower extremity 4+ and equal Normal cerebellar exam  Pt has a resting tremor on the L side  Skin: Skin is warm and dry.  Nursing note and vitals reviewed.    ED Treatments / Results  Labs (all labs ordered are listed, but only abnormal results are displayed) Labs Reviewed  BASIC METABOLIC PANEL - Abnormal; Notable for the following:       Result Value   Potassium 3.1 (*)    Glucose, Bld 105 (*)    All other components within normal limits  TROPONIN I - Abnormal; Notable for the following:    Troponin I 0.35 (*)    All other components within normal limits  HEPARIN LEVEL (UNFRACTIONATED) - Abnormal; Notable for the following:    Heparin Unfractionated <0.10 (*)    All other components within normal limits  I-STAT TROPOININ, ED - Abnormal; Notable for the following:    Troponin i, poc 0.32 (*)    All other components within normal limits  CBC  CBC  HEPARIN LEVEL (UNFRACTIONATED)  I-STAT TROPOININ, ED    EKG  EKG  Interpretation  Date/Time:  Monday Oct 17 2016 15:59:18 EDT Ventricular Rate:  104 PR Interval:  166 QRS Duration: 118 QT Interval:  370 QTC Calculation: 486 R Axis:   -44 Text Interpretation:  Sinus tachycardia Left axis deviation Left ventricular hypertrophy with QRS widening No acute changes Confirmed by Derwood Kaplan ([id number]) on 10/17/2016 10:26:31 PM       Radiology Dg Chest 2 View  Result Date: 10/17/2016 CLINICAL DATA:  Right-sided chest pain EXAM: CHEST  2 VIEW COMPARISON:  10/28/2015 FINDINGS: Low lung volumes. No focal consolidation or pleural effusion. Stable mild cardiomegaly. No pneumothorax. Degenerative changes of the spine. IMPRESSION: Low lung volumes without infiltrate or edema.  Mild cardiomegaly. Electronically Signed   By: Jasmine Pang M.D.   On: 10/17/2016 16:38    Procedures Procedures (including critical care time)  CRITICAL CARE  Performed by: Derwood Kaplan   Total critical care time: 38 minutes  Critical care time was exclusive of separately billable procedures and treating other patients.  Critical care was necessary to treat or prevent imminent or life-threatening deterioration.  Critical care was time spent personally by me on the following activities: development of treatment plan with patient and/or surrogate as well as nursing, discussions with consultants, evaluation of patient's response to treatment, examination of patient, obtaining history from patient or surrogate, ordering and performing treatments and interventions, ordering and review of laboratory studies, ordering and review of radiographic studies, pulse oximetry and re-evaluation of patient's condition.   Medications Ordered in ED Medications  heparin ADULT infusion 100 units/mL (25000 units/275mL sodium chloride 0.45%) (1,350 Units/hr Intravenous New Bag/Given 10/17/16 2357)  aspirin chewable tablet 324 mg (324 mg Oral Given 10/17/16 2340)  heparin bolus via infusion 4,000 Units  (4,000 Units Intravenous Bolus from Bag 10/17/16 2357)     Initial Impression / Assessment and Plan / ED Course  I have reviewed the triage vital signs and the nursing notes.  Pertinent labs & imaging results that were available during my care of the patient were reviewed by me and considered in my medical decision making (see chart for details).  Clinical Course as of Oct 18 121  Tue Oct 18, 2016  0122 Results from the ER workup discussed with the patient face to face and all questions answered to the best of my ability.  We will admit to hospital for NSTEMI. Still chest pain free. Troponin I: (!!) 0.35 [AN]    Clinical Course User Index [AN] Lexys Milliner, MD   Pt comes in with cc of atypica chest pain. He is currently chest pain free.  Differential diagnosis includes: ACS syndrome Aortic dissection CHF exacerbation Valvular disorder Myocarditis Pericarditis Endocarditis Pericardial effusion / tamponade Pneumonia PE Pneumothorax Musculoskeletal pain PUD / Gastritis / Esophagitis Esophageal spasm  Based on the exam - we will get trops x 2. Pt has HEAR score of 3 (1 for age, 1 fore risk factors and 1 for EKG). If trops x 2 are neg, we will d/c.  The tremor appears to be an essential tremor.  Final Clinical Impressions(s) / ED Diagnoses   Final diagnoses:  NSTEMI (non-ST elevated myocardial infarction) [facility])    New Prescriptions New Prescriptions   No medications on file     Derwood Kaplan, MD 10/18/16 [phone]

## 2016-10-18 NOTE — Interval H&P Note (Signed)
Cath Lab Visit (complete for each Cath Lab visit)  Clinical Evaluation Leading to the Procedure:   ACS: Yes.    Non-ACS:    Anginal Classification: CCS Roberson  Anti-ischemic medical therapy: Minimal Therapy (1 class of medications)  Non-Invasive Test Results: No non-invasive testing performed  Prior CABG: No previous CABG      History and Physical Interval Note:  10/18/2016 2:02 PM  Jason Roberson  has presented today for surgery, with the diagnosis of n stemi  The various methods of treatment have been discussed with the patient and family. After consideration of risks, benefits and other options for treatment, the patient has consented to  Procedure(s): Left Heart Cath and Coronary Angiography (N/A) as a surgical intervention .  The patient's history has been reviewed, patient examined, no change in status, stable for surgery.  I have reviewed the patient's chart and labs.  Questions were answered to the patient's satisfaction.     Jason Roberson

## 2016-10-18 NOTE — Consult Note (Signed)
NEURO HOSPITALIST CONSULT NOTE   Requestig physician: Dr. Orson AloeHenderson   Reason for Consult: possible parkinson's tremor   History obtained from:  Patient     HPI:                                                                                                                                          Jason Roberson is an 61 y.o. male [facility] pain. While in the hospital it was noted that patient had a tremor in his left hand in addition to a short shuffled gait and there was concern for possible Parkinson's. For this reason neurology was consult good. In talking to the patient, he has a hard time admitting the timeline but he believes that most the symptoms probably occurred greater than one year ago although he keeps reverting back to maybe a couple months 4-6 months ago.  Patient has noted that recently he has had drooling for no known reason, he needs to use both hands and rocks in order to get up out of a chair, he has a shuffling gait which at times can cause him to have gait issues. He does not have any known history of Parkinson's or parkinsonism and his family that he knows of. He continues to do well with cognition per patient such as taking care of the bills and continues to drive. He does note that the left hand is more prominent than the right hand with tremor and that the tremor seems to be intermittent as at times it is not present.  Past Medical History:  Diagnosis Date  . Hypertension   . Nosebleed     History reviewed. No pertinent surgical history.   Family History: Mother HTN Father HTN  Social History:  reports that he has never smoked. He has never used smokeless tobacco. He reports that he does not drink alcohol or use drugs.  No Known Allergies  MEDICATIONS:                                                                                                                     Scheduled: . amLODipine  5 mg Oral  Daily  . aspirin EC  81 mg Oral Daily  . atorvastatin  80 mg Oral  q1800     ROS:                                                                                                                                       History obtained from the patient  General ROS: negative for - chills, fatigue, fever, night sweats, weight gain or weight loss Psychological ROS: negative for - behavioral disorder, hallucinations, memory difficulties, mood swings or suicidal ideation Ophthalmic ROS: negative for - blurry vision, double vision, eye pain or loss of vision ENT ROS: negative for - epistaxis, nasal discharge, oral lesions, sore throat, tinnitus or vertigo Allergy and Immunology ROS: negative for - hives or itchy/watery eyes Hematological and Lymphatic ROS: negative for - bleeding problems, bruising or swollen lymph nodes Endocrine ROS: negative for - galactorrhea, hair pattern changes, polydipsia/polyuria or temperature intolerance Respiratory ROS: negative for - cough, hemoptysis, shortness of breath or wheezing Cardiovascular ROS: negative for - chest pain, dyspnea on exertion, edema or irregular heartbeat Gastrointestinal ROS: negative for - abdominal pain, diarrhea, hematemesis, nausea/vomiting or stool incontinence Genito-Urinary ROS: negative for - dysuria, hematuria, incontinence or urinary frequency/urgency Musculoskeletal ROS: negative for - joint swelling or muscular weakness Neurological ROS: as noted in HPI Dermatological ROS: negative for rash and skin lesion changes   Blood pressure (!) 168/97, pulse 82, temperature 98 F (36.7 C), temperature source Oral, resp. rate 17, height 5\' 8"  (1.727 m), weight 122.1 kg (269 lb 3.2 oz), SpO2 99 %.   Neurologic Examination:                                                                                                      HEENT-  Normocephalic, no lesions, without obvious abnormality.  Normal external eye and conjunctiva.  Normal TM's  bilaterally.  Normal auditory canals and external ears. Normal external nose, mucus membranes and septum.  Normal pharynx. Cardiovascular- S1, S2 normal, pulses palpable throughout   Lungs- chest clear, no wheezing, rales, normal symmetric air entry, Heart exam - S1, S2 normal, no murmur, no gallop, rate regular Abdomen- normal findings: bowel sounds normal Extremities- no cyanosis Lymph-no adenopathy palpable Musculoskeletal-no joint tenderness, deformity or swelling Skin-warm and dry, no hyperpigmentation, vitiligo, or suspicious lesions  Neurological Examination Mental Status: Alert, oriented, thought content appropriate.  Speech fluent without evidence of aphasia--patient does have a stutter however he states this is long-standing and nothing new.  Able to follow 3 step commands without difficulty. Cranial Nerves: II: Visual fields grossly normal,  III,IV,  VI: ptosis not present, extra-ocular motions intact bilaterally pupils equal, round, reactive to light and accommodation V,VII: smile symmetric, facial light touch sensation normal bilaterally VIII: hearing normal bilaterally IX,X: uvula rises symmetrically XI: bilateral shoulder shrug XII: midline tongue extension Motor: Right : Upper extremity   5/5    Left:     Upper extremity   5/5  Lower extremity   5/5     Lower extremity   5/5   Resting tremor L > R hand with parkinsonian appearance. Possible minimally increased tone.   Sensory: Pinprick and light touch intact throughout, bilaterally Deep Tendon Reflexes: Depressed DTRs throughout Plantars: Mute bilaterally  Cerebellar: normal finger-to-nose,  Gait: Not tested for safety however nurse did take him to the bathroom earlier in the day and stated that he had a very short shuffled gait to which the patient does admit to      Lab Results: Basic Metabolic Panel:  Recent Labs Lab 10/17/16 1604  NA 139  K 3.1*  CL 104  CO2 26  GLUCOSE 105*  BUN 15  CREATININE  1.13  CALCIUM 9.1    Liver Function Tests: No results for input(s): AST, ALT, ALKPHOS, BILITOT, PROT, ALBUMIN in the last 168 hours. No results for input(s): LIPASE, AMYLASE in the last 168 hours. No results for input(s): AMMONIA in the last 168 hours.  CBC:  Recent Labs Lab 10/17/16 1604 10/18/16 0002  WBC 6.2 7.2  HGB 14.3 13.7  HCT 41.3 41.4  MCV 87.1 88.3  PLT 248 234    Cardiac Enzymes:  Recent Labs Lab 10/17/16 2333 10/18/16 0441  TROPONINI 0.35* 0.15*    Lipid Panel:  Recent Labs Lab 10/17/16 2333  CHOL 133  TRIG 40  HDL 42  CHOLHDL 3.2  VLDL 8  LDLCALC 83    CBG: No results for input(s): GLUCAP in the last 168 hours.  Microbiology: Results for orders placed or performed during the hospital encounter of 10/17/16  MRSA PCR Screening     Status: None   Collection Time: 10/18/16  2:49 AM  Result Value Ref Range Status   MRSA by PCR NEGATIVE NEGATIVE Final    Comment:        The GeneXpert MRSA Assay (FDA approved for NASAL specimens only), is one component of a comprehensive MRSA colonization surveillance program. It is not intended to diagnose MRSA infection nor to guide or monitor treatment for MRSA infections.     Coagulation Studies: No results for input(s): LABPROT, INR in the last 72 hours.  Imaging: Dg Chest 2 View  Result Date: 10/17/2016 CLINICAL DATA:  Right-sided chest pain EXAM: CHEST  2 VIEW COMPARISON:  10/28/2015 FINDINGS: Low lung volumes. No focal consolidation or pleural effusion. Stable mild cardiomegaly. No pneumothorax. Degenerative changes of the spine. IMPRESSION: Low lung volumes without infiltrate or edema.  Mild cardiomegaly. Electronically Signed   By: Jasmine Pang M.D.   On: 10/17/2016 16:38       Assessment and plan per attending neurologist  Felicie Morn PA-C [facility] [phone]  10/18/2016, 10:45 AM   Assessment/Plan: 61 yo M with resting tremor most consistent with parkinsonism.  The asymmetric L > R involvement and slow progression would suggest idiopathic parkinsons disease, but will need follow up for confirmation. I would favor starting sinemet to assess for response.   1) Start sinemet 25/100mg  1 tab TID.  2) Will refer for outpatient follow up with Dr. Arbutus Leas of [place] [facility].  3) No further recommendations at this  time, please call with further questions or concerns.    Ritta Slot, MD [facility] [phone]  If 7pm- 7am, please page neurology on call as listed in AMION.

## 2016-10-18 NOTE — H&P (Signed)
History & Physical    Patient ID: Jason Roberson MRN: [id number], DOB/AGE: 60-22-1957   Admit date: 10/17/2016   Primary Physician: Patient, No Pcp Per Primary Cardiologist: none   History of Present Illness    Jason Roberson is a 61 y.o. male with past medical history of hypertension, epistaxis, and no follow up with a primary care physician is here for chest pain.  Jason Roberson says after waking up from a nap and walking up a flight of stairs to his room, he started having some chest pain that was in the center of his chest.  The pain was sharp, initially 7/10 in intensity, and lasted around 7 to 8 minutes.  Along with chest pain was shortness of breath.  He says that he felt lightheaded with his pain and as if he was going to pass out but never did.  Since that event, he has been having similar chest pain throughout the day that would last a few minutes.  Because he kept having chest pain and shortness of breath, he became worried and he drove himself to our emergency department.  He has never experienced any chest pain like this before.  He denies any other symptoms such as palpitations, fainting, nausea, or sweating.  Jason Roberson also denies any swelling in his legs at this time, waking up in the middle of the night due to shortness of breath, or problems with laying flat due to shortness of breath.  At this time, he does not have any chest pain or shortness of breath.  Of note, he says that he suppose to take blood pressure medications but ran out of his medications about 4 months ago due to not being able to afford his medications.    Past Medical History    Past Medical History:  Diagnosis Date  . Hypertension   . Nosebleed     History reviewed. No pertinent surgical history.   Allergies  No Known Allergies   Home Medications    Prior to Admission medications   Medication Sig Start Date End Date Taking? Authorizing Provider  amLODipine (NORVASC) 5 MG tablet Take 1 tablet (5  mg total) by mouth daily. Patient not taking: Reported on 10/17/2016 05/14/14   Azalia Bilis, MD  metoprolol (LOPRESSOR) 50 MG tablet Take 0.5 tablets (25 mg total) by mouth 2 (two) times daily. Patient not taking: Reported on 10/17/2016 10/28/15   Lindalou Hose, MD    Family History    No family history on file.  Social History    Social History   Social History  . Marital status: Single    Spouse name: N/A  . Number of children: N/A  . Years of education: N/A   Occupational History  . Not on file.   Social History Main Topics  . Smoking status: Never Smoker  . Smokeless tobacco: Never Used  . Alcohol use No  . Drug use: No  . Sexual activity: Not on file   Other Topics Concern  . Not on file   Social History Narrative  . No narrative on file     Review of Systems   All other systems reviewed and are otherwise negative except as noted above.  Physical Exam    Blood pressure 134/85, pulse 95, temperature 99.2 F (37.3 C), temperature source Oral, resp. rate 19, height 5\' 8"  (1.727 m), weight 123.8 kg (273 lb), SpO2 98 %.  General: Obese African-American male, well nourished,male in no acute distress. Head: Normocephalic,  atraumatic, sclera non-icteric, no xanthomas, nares are without discharge. Dentition:  Neck: No carotid bruits. JVD not elevated.  Lungs: Respirations regular and unlabored, without wheezes or rales.  Heart: Regular rate and rhythm. No S3 or S4.  No murmur, no rubs, or gallops appreciated. Abdomen: Soft, non-tender, non-distended with normoactive bowel sounds. No hepatomegaly. No rebound/guarding. No obvious abdominal masses. Msk:  Strength and tone appear normal for age. No joint deformities or effusions. Extremities: No clubbing or cyanosis. No edema.  Distal pedal pulses are 2+ bilaterally. Neuro: Alert and oriented X 3. Moves all extremities spontaneously. No focal deficits noted. Psych:  Responds to questions appropriately with a normal  affect. Skin: No rashes or lesions noted  Labs    Troponin [facility](Point of Care Test)  Recent Labs  10/17/16 2259  TROPIPOC 0.32*    Recent Labs  10/17/16 2333  TROPONINI 0.35*   Lab Results  Component Value Date   WBC 7.2 10/18/2016   HGB 13.7 10/18/2016   HCT 41.4 10/18/2016   MCV 88.3 10/18/2016   PLT 234 10/18/2016    Recent Labs Lab 10/17/16 1604  NA 139  K 3.1*  CL 104  CO2 26  BUN 15  CREATININE 1.13  CALCIUM 9.1  GLUCOSE 105*   Lab Results  Component Value Date   CHOL 129 09/26/2013   HDL 47 09/26/2013   LDLCALC 73 09/26/2013   TRIG 44 09/26/2013   No results found for: DDIMER  No results found for: BNP No results found for: PROBNP No results for input(s): INR in the last 72 hours.    Radiology Studies    Dg Chest 2 View  Result Date: 10/17/2016 CLINICAL DATA:  Right-sided chest pain EXAM: CHEST  2 VIEW COMPARISON:  10/28/2015 FINDINGS: Low lung volumes. No focal consolidation or pleural effusion. Stable mild cardiomegaly. No pneumothorax. Degenerative changes of the spine. IMPRESSION: Low lung volumes without infiltrate or edema.  Mild cardiomegaly. Electronically Signed   By: Jasmine PangKim  Fujinaga M.D.   On: 10/17/2016 16:38    EKG & Cardiac Imaging    EKG:  Sinus tachycardia; left anterior fascicular block ECHOCARDIOGRAM:  Assessment & Plan    Principal Problem:   NSTEMI (non-ST elevated myocardial infarction) [facility](HCC) Active Problems:   Chest pain   Hypertension   Dyspnea  # Chest pain Patient with recurrent chest pain in the setting of an elevated troponin level.  Patient is likely suffering from NSTEMI-ACS.  Low suspicion for non-coronary artery related chest pain at this time.  His ECG is without any signs of ischemia/infarction.  He is currently chest pain free.  He was given high dose aspirin and heparin in the emergency department.   - Continue aspirin daily and heparin drip. - Assess other risk factors with lipid panel and A1C. - Check  echocardiogram in the morning. - Recommend moving forward with a left heart catheterization. - Make patient NPO after midnight.    # Hypertension Patient with known history of essential hypertension.  He doe not have any over end-organ damage.  Blood pressure on average is at goal. - Start metoprolol 12.5 mg/daily.  # Hypokalemia Patient with hypokalemia likely secondary to dietary intake.  Low suspicion for an endocrinopathy etiology. - Replete with PO potassium.  # Prophylaxis - Heparin drip.  Signed, Judie GrieveKamal D Henderson, MD 10/18/2016, 2:03 AM

## 2016-10-18 NOTE — Progress Notes (Signed)
  Echocardiogram 2D Echocardiogram has been performed.  Jason PartridgeBrooke S Amberlie Roberson 10/18/2016, 10:16 AM

## 2016-10-18 NOTE — Progress Notes (Signed)
Patient with BP 162/103. Text paged Dr. Rennis GoldenHilty. Will await further orders. Patient comfortable, no complaints at this time. Asher Muir- Eliyas Suddreth,RN

## 2016-10-19 ENCOUNTER — Telehealth: Payer: Self-pay | Admitting: Internal Medicine

## 2016-10-19 ENCOUNTER — Encounter (HOSPITAL_COMMUNITY): Payer: Self-pay | Admitting: Interventional Cardiology

## 2016-10-19 DIAGNOSIS — G20 Parkinson's disease: Secondary | ICD-10-CM

## 2016-10-19 DIAGNOSIS — R7301 Impaired fasting glucose: Secondary | ICD-10-CM

## 2016-10-19 LAB — BASIC METABOLIC PANEL WITH GFR
Anion gap: 8 (ref 5–15)
BUN: 9 mg/dL (ref 6–20)
CO2: 25 mmol/L (ref 22–32)
Calcium: 8.5 mg/dL — ABNORMAL LOW (ref 8.9–10.3)
Chloride: 104 mmol/L (ref 101–111)
Creatinine, Ser: 0.87 mg/dL (ref 0.61–1.24)
GFR calc Af Amer: >60 mL/min (ref >60)
GFR calc non Af Amer: >60 mL/min (ref >60)
Glucose, Bld: 94 mg/dL (ref 65–99)
Potassium: 3.2 mmol/L — ABNORMAL LOW (ref 3.5–5.1)
Sodium: 137 mmol/L (ref 135–145)

## 2016-10-19 LAB — HEMOGLOBIN A1C
Hgb A1c MFr Bld: 5.6 % (ref 4.8–5.6)
MEAN PLASMA GLUCOSE: 114 mg/dL

## 2016-10-19 MED ORDER — ATORVASTATIN CALCIUM 80 MG PO TABS: 80.0000 mg | ORAL_TABLET | Freq: Every day | ORAL | 6 refills | Status: DC

## 2016-10-19 MED ORDER — ASPIRIN 81 MG PO CHEW: 81.0000 mg | CHEWABLE_TABLET | Freq: Every day | ORAL | 6 refills | Status: AC

## 2016-10-19 MED ORDER — CARVEDILOL 6.25 MG PO TABS: 6.2500 mg | ORAL_TABLET | Freq: Two times a day (BID) | ORAL | 6 refills | Status: DC

## 2016-10-19 MED ORDER — AMLODIPINE BESYLATE 10 MG PO TABS: 10.0000 mg | ORAL_TABLET | Freq: Every day | ORAL | 6 refills | Status: AC

## 2016-10-19 MED ORDER — NITROGLYCERIN 0.4 MG SL SUBL: 0.4000 mg | SUBLINGUAL_TABLET | SUBLINGUAL | 12 refills | Status: AC | PRN

## 2016-10-19 MED ORDER — CARBIDOPA-LEVODOPA 25-100 MG PO TABS: 1.0000 | ORAL_TABLET | Freq: Three times a day (TID) | ORAL | 3 refills | Status: DC

## 2016-10-19 MED FILL — Heparin Sodium (Porcine) Inj 1000 Unit/ML: INTRAMUSCULAR | Qty: 10 | Status: AC

## 2016-10-19 NOTE — Telephone Encounter (Signed)
New message      TOC appt scheduled on 10-31-16 at 11:30 with [facility]

## 2016-10-19 NOTE — Discharge Summary (Signed)
Discharge Summary    Patient ID: Jason Roberson,  MRN: [id number], DOB/AGE: [birth year] 61 y.o.  Admit date: 10/17/2016 Discharge date: 10/19/2016  Primary Care Provider: Patient, No Pcp Per Primary Cardiologist: Dr. Rennis Golden  Discharge Diagnoses    Principal Problem:   NSTEMI (non-ST elevated myocardial infarction) [facility]) Active Problems:   Chest pain   Hypertension   Dyspnea   Tremor   Parkinson's disease (HCC)   Impaired fasting glucose   CAD  Diastolic dysfunction   Allergies No Known Allergies  Diagnostic Studies/Procedures    Left Heart Cath and Coronary Angiography   10/18/16  Conclusion    90% stenosis in a small second diagonal branch.  Widely patent LAD and first diagonal.  Normal circumflex artery.  Widely patent/normal codominant right coronary.  Normal left ventricular systolic function with EF 65%.  RECOMMENDATIONS:   Major arteries are widely patent. The small second diagonal that has significant obstruction but is too small to intervene upon.  Aggressive risk factor modification, including blood pressure control and lipid lowering.   Echo 10/18/16 Study Conclusions  - Left ventricle: The cavity size was normal. There was mild   concentric hypertrophy. Systolic function was normal. The   estimated ejection fraction was in the range of 60% to 65%. Wall   motion was normal; there were no regional wall motion   abnormalities. Doppler parameters are consistent with abnormal   left ventricular relaxation (grade 1 diastolic dysfunction). - Aortic valve: Valve area (VTI): 5.64 cm^2. Valve area (Vmax):   4.53 cm^2. Valve area (Vmean): 4.63 cm^2. - Mitral valve: There was mild to moderate regurgitation directed   posteriorly. - Left atrium: The atrium was moderately dilated.   History of Present Illness     Jason Roberson is a 61 y.o. male with past medical history of hypertension, epistaxis, and no follow up with a primary care physician came to  [facility] ER  for chest pain.  [name] says after waking up from a nap and walking up a flight of stairs to his room, he started having some chest pain that was in the center of his chest.  The pain was sharp, initially 7/10 in intensity, and lasted around 7 to 8 minutes.  Along with chest pain was shortness of breath.  He says that he felt lightheaded with his pain and as if he was going to pass out but never did.  Since that event, he has been having similar chest pain throughout the day that would last a few minutes.  Because he kept having chest pain and shortness of breath, he became worried and he drove himself to our emergency department.  He has never experienced any chest pain like this before.  He denies any other symptoms such as palpitations, fainting, nausea, or sweating.  [name] also denies any swelling in his legs at this time, waking up in the middle of the night due to shortness of breath, or problems with laying flat due to shortness of breath.  At this time, he does not have any chest pain or shortness of breath.  Of note, he says that he suppose to take blood pressure medications but ran out of his medications about 4 months ago due to not being able to afford his medications.    His ECG is without any signs of ischemia/infarction.  POc troponin 0.05-->0.32.   Hospital Course     Consultants: Neurology   Admitted and Treated with IV heparin. Troponin trend 0.05,  0.32, 0.35, 0.15, 0.08. Cath showed 95% stenosed small 2nd dig branch but vessel is too small for intervention. Widely patent other coronaries. EF 60-65% with normal wall motion on echo. Mild to moderate MR. Started ASA and statin. Needed medication adjustment for high blood pressure. Ambulated without angina with cardiac rehab. He will enroll in phase 2.  The patient was evaluated by neurology for resting tremor. The asymmetric L > R involvement and slow progression would suggest idiopathic parkinsons disease. Started on  sinemet 25/100mg  1 tab TID. Recommended outpatient follow up with Dr. Arbutus Leasat of [facility] (Dr. Amada JupiterKirkpatrick will make referral spoke personally, Dr. Arbutus Leasat will review the chart and their office will contact the patient. If not accepted as new patient--> please defer to PCP).   10/17/2016: Cholesterol 133; HDL 42; LDL Cholesterol 83; Triglycerides 40; VLDL 8. Continue statin.   The patient has been seen by Dr. Rennis GoldenHilty today and deemed ready for discharge home. All follow-up appointments have been scheduled. Discharge medications are listed below.   Discharge Vitals Blood pressure 124/81, pulse 90, temperature 98.4 F (36.9 C), temperature source Oral, resp. rate 19, height 5\' 8"  (1.727 m), weight 269 lb 3.2 oz (122.1 kg), SpO2 98 %.  Filed Weights   10/17/16 1610 10/18/16 0255  Weight: 273 lb (123.8 kg) 269 lb 3.2 oz (122.1 kg)    Labs & Radiologic Studies     CBC  Recent Labs  10/18/16 0002 10/18/16 1618  WBC 7.2 6.6  HGB 13.7 14.0  HCT 41.4 40.9  MCV 88.3 88.7  PLT 234 225   Basic Metabolic Panel  Recent Labs  10/18/16 1027 10/18/16 1618 10/19/16 0211  NA 137  --  137  K 3.0*  --  3.2*  CL 104  --  104  CO2 26  --  25  GLUCOSE 102*  --  94  BUN 11  --  9  CREATININE 0.97 0.98 0.87  CALCIUM 8.8*  --  8.5*   Liver Function Tests No results for input(s): AST, ALT, ALKPHOS, BILITOT, PROT, ALBUMIN in the last 72 hours. No results for input(s): LIPASE, AMYLASE in the last 72 hours. Cardiac Enzymes  Recent Labs  10/17/16 2333 10/18/16 0441 10/18/16 1027  TROPONINI 0.35* 0.15* 0.08*   BNP Invalid input(s): POCBNP D-Dimer No results for input(s): DDIMER in the last 72 hours. Hemoglobin A1C  Recent Labs  10/18/16 0441  HGBA1C 5.6   Fasting Lipid Panel  Recent Labs  10/17/16 2333  CHOL 133  HDL 42  LDLCALC 83  TRIG 40  CHOLHDL 3.2   Thyroid Function Tests No results for input(s): TSH, T4TOTAL, T3FREE, THYROIDAB in the last 72 hours.  Invalid  input(s): FREET3  Dg Chest 2 View  Result Date: 10/17/2016 CLINICAL DATA:  Right-sided chest pain EXAM: CHEST  2 VIEW COMPARISON:  10/28/2015 FINDINGS: Low lung volumes. No focal consolidation or pleural effusion. Stable mild cardiomegaly. No pneumothorax. Degenerative changes of the spine. IMPRESSION: Low lung volumes without infiltrate or edema.  Mild cardiomegaly. Electronically Signed   By: Jasmine PangKim  Fujinaga M.D.   On: 10/17/2016 16:38    Disposition   Pt is being discharged home today in good condition.  Follow-up Plans & Appointments    Follow-up Information    [facility]. Go on 10/26/2016.   Why:  at 10:30 am for medical doctor appointment Contact information: [address] [place] [phone]       [facility]  [facility].   Why:  Registerd nurse and Quarry manager information: [address] [place] [address] [phone]        [facility], [place]. Go on 10/31/2016.   Specialties:  Physician Assistant, Cardiology Why:  @11 :30 am for post [employer] information: [address] [place] [address] [phone]        Chrystie Nose, MD. Go on 12/20/2016.   Specialty:  Cardiology Why:  @9 :00 am for follow up Contact information: [address] [place] [address] [phone]        [facility]. Go on 10/19/2016.   Why:  Dr. Arbutus Leas office will call with appointment  Contact information: [address] [place], [address] [place] [address] [phone]         Discharge Instructions    Amb Referral to Cardiac Rehabilitation    Complete by:  As directed    Diagnosis:  NSTEMI   Diet - low sodium heart healthy    Complete by:  As directed    Discharge instructions    Complete by:  As directed    NO HEAVY LIFTING (>10lbs) X 2 WEEKS. NO SEXUAL ACTIVITY X 2 WEEKS. NO DRIVING X 1 WEEK. NO SOAKING BATHS, HOT TUBS, POOLS,  ETC., X 7 DAYS.  Return to work on 10/24/16.   Increase activity slowly    Complete by:  As directed       Discharge Medications   Current Discharge Medication List    START taking these medications   Details  aspirin 81 MG chewable tablet Chew 1 tablet (81 mg total) by mouth daily. Qty: 30 tablet, Refills: 6    atorvastatin (LIPITOR) 80 MG tablet Take 1 tablet (80 mg total) by mouth daily at 6 PM. Qty: 30 tablet, Refills: 6    carbidopa-levodopa (SINEMET IR) 25-100 MG tablet Take 1 tablet by mouth 3 (three) times daily. Need PCP or neurology appointment who will continue the medications Qty: 90 tablet, Refills: 3    carvedilol (COREG) 6.25 MG tablet Take 1 tablet (6.25 mg total) by mouth 2 (two) times daily with a meal. Qty: 60 tablet, Refills: 6    nitroGLYCERIN (NITROSTAT) 0.4 MG SL tablet Place 1 tablet (0.4 mg total) under the tongue every 5 (five) minutes x 3 doses as needed for chest pain. Qty: 25 tablet, Refills: 12      CONTINUE these medications which have CHANGED   Details  amLODipine (NORVASC) 10 MG tablet Take 1 tablet (10 mg total) by mouth daily. Qty: 30 tablet, Refills: 6      STOP taking these medications     metoprolol (LOPRESSOR) 50 MG tablet            Outstanding Labs/Studies   Consider OP f/u labs 6-8 weeks given statin initiation this admission.  Duration of Discharge Encounter   Greater than 30 minutes including physician time.  Signed, Aevah Stansbery PA-C 10/19/2016, 12:48 PM

## 2016-10-19 NOTE — Telephone Encounter (Signed)
Patient still in hospital 10/19/16 First TOC outreach pended to after discharge

## 2016-10-19 NOTE — Progress Notes (Signed)
DAILY PROGRESS NOTE   Patient Name: Jason Roberson Date of Encounter: 10/19/2016  Hospital Problem List   Principal Problem:   NSTEMI (non-ST elevated myocardial infarction) [facility]) Active Problems:   Chest pain   Hypertension   Dyspnea   Tremor   Parkinson's disease ([place])   Impaired fasting glucose    Chief Complaint   No complaints today - wants to go home  Subjective   BP elevated overnight- additional amlodipine given last PM. Carvedilol started. Amlodipine increased to 10 mg this am. BP improved overnight. Cath yesterday shows branch diagonal vessel disease, likely the culprit vessel in the setting of hypertensive urgency - too small to intervene upon.  Objective   Vitals:   10/19/16 0000 10/19/16 0200 10/19/16 0400 10/19/16 0700  BP:      Pulse:      Resp:      Temp: 98.5 F (36.9 C)  98.2 F (36.8 C) 98.6 F (37 C)  TempSrc: Oral  Oral   SpO2:  98%    Weight:      Height:        Intake/Output Summary (Last 24 hours) at 10/19/16 0840 Last data filed at 10/19/16 0600  Gross per 24 hour  Intake                0 ml  Output             1100 ml  Net            -1100 ml   Filed Weights   10/17/16 1610 10/18/16 0255  Weight: 273 lb (123.8 kg) 269 lb 3.2 oz (122.1 kg)    Physical Exam   General appearance: alert and no distress Lungs: clear to auscultation bilaterally Heart: regular rate and rhythm Extremities: extremities normal, atraumatic, no cyanosis or edema Neurologic: Grossly normal  Inpatient Medications    Scheduled Meds: . amLODipine  10 mg Oral Daily  . aspirin  81 mg Oral Daily  . atorvastatin  80 mg Oral q1800  . carbidopa-levodopa  1 tablet Oral TID  . carvedilol  6.25 mg Oral BID WC  . enoxaparin (LOVENOX) injection  40 mg Subcutaneous Q24H  . potassium chloride  40 mEq Oral Once  . sodium chloride flush  3 mL Intravenous Q12H    Continuous Infusions: . sodium chloride      PRN Meds: sodium chloride, acetaminophen,  benzonatate, nitroGLYCERIN, ondansetron (ZOFRAN) IV, oxyCODONE-acetaminophen, sodium chloride flush   Labs   Results for orders placed or performed during the hospital encounter of 10/17/16 (from the past 48 hour(s))  Basic metabolic panel     Status: Abnormal   Collection Time: 10/17/16  4:04 PM  Result Value Ref Range   Sodium 139 135 - 145 mmol/L   Potassium 3.1 (L) 3.5 - 5.1 mmol/L   Chloride 104 101 - 111 mmol/L   CO2 26 22 - 32 mmol/L   Glucose, Bld 105 (H) 65 - 99 mg/dL   BUN 15 6 - 20 mg/dL   Creatinine, Ser 1.13 0.61 - 1.24 mg/dL   Calcium 9.1 8.9 - 10.3 mg/dL   GFR calc non Af Amer >60 >60 mL/min   GFR calc Af Amer >60 >60 mL/min    Comment: (NOTE) The eGFR has been calculated using the CKD EPI equation. This calculation has not been validated in all clinical situations. eGFR's persistently <60 mL/min signify possible Chronic Kidney Disease.    Anion gap 9 5 - 15  CBC  Status: None   Collection Time: 10/17/16  4:04 PM  Result Value Ref Range   WBC 6.2 4.0 - 10.5 K/uL   RBC 4.74 4.22 - 5.81 MIL/uL   Hemoglobin 14.3 13.0 - 17.0 g/dL   HCT 41.3 39.0 - 52.0 %   MCV 87.1 78.0 - 100.0 fL   MCH 30.2 26.0 - 34.0 pg   MCHC 34.6 30.0 - 36.0 g/dL   RDW 13.0 11.5 - 15.5 %   Platelets 248 150 - 400 K/uL  I-stat troponin, ED     Status: None   Collection Time: 10/17/16  4:08 PM  Result Value Ref Range   Troponin i, poc 0.05 0.00 - 0.08 ng/mL   Comment 3            Comment: Due to the release kinetics of cTnI, a negative result within the first hours of the onset of symptoms does not rule out myocardial infarction with certainty. If myocardial infarction is still suspected, repeat the test at appropriate intervals.   I-stat troponin, ED     Status: Abnormal   Collection Time: 10/17/16 10:59 PM  Result Value Ref Range   Troponin i, poc 0.32 (HH) 0.00 - 0.08 ng/mL   Comment NOTIFIED PHYSICIAN    Comment 3            Comment: Due to the release kinetics of cTnI, a  negative result within the first hours of the onset of symptoms does not rule out myocardial infarction with certainty. If myocardial infarction is still suspected, repeat the test at appropriate intervals.   Troponin I     Status: Abnormal   Collection Time: 10/17/16 11:33 PM  Result Value Ref Range   Troponin I 0.35 (HH) <0.03 ng/mL    Comment: CRITICAL RESULT CALLED TO, READ BACK BY AND VERIFIED WITH: Jackson North RN 10/18/2016 0033 JORDANS   Lipid panel     Status: None   Collection Time: 10/17/16 11:33 PM  Result Value Ref Range   Cholesterol 133 0 - 200 mg/dL   Triglycerides 40 <150 mg/dL   HDL 42 >40 mg/dL   Total CHOL/HDL Ratio 3.2 RATIO   VLDL 8 0 - 40 mg/dL   LDL Cholesterol 83 0 - 99 mg/dL    Comment:        Total Cholesterol/HDL:CHD Risk Coronary Heart Disease Risk Table                     Men   Women  1/2 Average Risk   3.4   3.3  Average Risk       5.0   4.4  2 X Average Risk   9.6   7.1  3 X Average Risk  23.4   11.0        Use the calculated Patient Ratio above and the CHD Risk Table to determine the patient's CHD Risk.        ATP III CLASSIFICATION (LDL):  <100     mg/dL   Optimal  100-129  mg/dL   Near or Above                    Optimal  130-159  mg/dL   Borderline  160-189  mg/dL   High  >190     mg/dL   Very High   Heparin level (unfractionated)     Status: Abnormal   Collection Time: 10/18/16 12:00 AM  Result Value Ref Range   Heparin Unfractionated <0.10 (  L) 0.30 - 0.70 IU/mL    Comment:        IF HEPARIN RESULTS ARE BELOW EXPECTED VALUES, AND PATIENT DOSAGE HAS BEEN CONFIRMED, SUGGEST FOLLOW UP TESTING OF ANTITHROMBIN III LEVELS. REPEATED TO VERIFY   CBC     Status: None   Collection Time: 10/18/16 12:02 AM  Result Value Ref Range   WBC 7.2 4.0 - 10.5 K/uL   RBC 4.69 4.22 - 5.81 MIL/uL   Hemoglobin 13.7 13.0 - 17.0 g/dL   HCT 41.4 39.0 - 52.0 %   MCV 88.3 78.0 - 100.0 fL   MCH 29.2 26.0 - 34.0 pg   MCHC 33.1 30.0 - 36.0 g/dL    RDW 13.3 11.5 - 15.5 %   Platelets 234 150 - 400 K/uL  MRSA PCR Screening     Status: None   Collection Time: 10/18/16  2:49 AM  Result Value Ref Range   MRSA by PCR NEGATIVE NEGATIVE    Comment:        The GeneXpert MRSA Assay (FDA approved for NASAL specimens only), is one component of a comprehensive MRSA colonization surveillance program. It is not intended to diagnose MRSA infection nor to guide or monitor treatment for MRSA infections.   HIV antibody (Routine Testing)     Status: None   Collection Time: 10/18/16  4:41 AM  Result Value Ref Range   HIV Screen 4th Generation wRfx Non Reactive Non Reactive    Comment: (NOTE) Performed At: [facility] [place], [place] [id number] Lindon Romp MD [id number]   Hemoglobin A1c     Status: None   Collection Time: 10/18/16  4:41 AM  Result Value Ref Range   Hgb A1c MFr Bld 5.6 4.8 - 5.6 %    Comment: (NOTE)         Pre-diabetes: 5.7 - 6.4         Diabetes: >6.4         Glycemic control for adults with diabetes: <7.0    Mean Plasma Glucose 114 mg/dL    Comment: (NOTE) Performed At: [facility] [place], [place] [id number] Lindon Romp MD [id number]   Troponin I     Status: Abnormal   Collection Time: 10/18/16  4:41 AM  Result Value Ref Range   Troponin I 0.15 (HH) <0.03 ng/mL    Comment: CRITICAL VALUE NOTED.  VALUE IS CONSISTENT WITH PREVIOUSLY REPORTED AND CALLED VALUE.  Troponin I     Status: Abnormal   Collection Time: 10/18/16 10:27 AM  Result Value Ref Range   Troponin I 0.08 (HH) <0.03 ng/mL    Comment: CRITICAL VALUE NOTED.  VALUE IS CONSISTENT WITH PREVIOUSLY REPORTED AND CALLED VALUE.  Basic metabolic panel     Status: Abnormal   Collection Time: 10/18/16 10:27 AM  Result Value Ref Range   Sodium 137 135 - 145 mmol/L   Potassium 3.0 (L) 3.5 - 5.1 mmol/L   Chloride 104 101 - 111 mmol/L   CO2 26 22 - 32 mmol/L   Glucose, Bld 102 (H) 65 - 99 mg/dL     BUN 11 6 - 20 mg/dL   Creatinine, Ser 0.97 0.61 - 1.24 mg/dL   Calcium 8.8 (L) 8.9 - 10.3 mg/dL   GFR calc non Af Amer >60 >60 mL/min   GFR calc Af Amer >60 >60 mL/min    Comment: (NOTE) The eGFR has been calculated using the CKD EPI equation. This calculation has not been  validated in all clinical situations. eGFR's persistently <60 mL/min signify possible Chronic Kidney Disease.    Anion gap 7 5 - 15  Protime-INR     Status: None   Collection Time: 10/18/16 10:27 AM  Result Value Ref Range   Prothrombin Time 14.6 11.4 - 15.2 seconds   INR 1.13   Heparin level (unfractionated)     Status: None   Collection Time: 10/18/16 10:27 AM  Result Value Ref Range   Heparin Unfractionated 0.44 0.30 - 0.70 IU/mL    Comment:        IF HEPARIN RESULTS ARE BELOW EXPECTED VALUES, AND PATIENT DOSAGE HAS BEEN CONFIRMED, SUGGEST FOLLOW UP TESTING OF ANTITHROMBIN III LEVELS.   CBC     Status: None   Collection Time: 10/18/16  4:18 PM  Result Value Ref Range   WBC 6.6 4.0 - 10.5 K/uL   RBC 4.61 4.22 - 5.81 MIL/uL   Hemoglobin 14.0 13.0 - 17.0 g/dL   HCT 40.9 39.0 - 52.0 %   MCV 88.7 78.0 - 100.0 fL   MCH 30.4 26.0 - 34.0 pg   MCHC 34.2 30.0 - 36.0 g/dL   RDW 13.0 11.5 - 15.5 %   Platelets 225 150 - 400 K/uL  Creatinine, serum     Status: None   Collection Time: 10/18/16  4:18 PM  Result Value Ref Range   Creatinine, Ser 0.98 0.61 - 1.24 mg/dL   GFR calc non Af Amer >60 >60 mL/min   GFR calc Af Amer >60 >60 mL/min    Comment: (NOTE) The eGFR has been calculated using the CKD EPI equation. This calculation has not been validated in all clinical situations. eGFR's persistently <60 mL/min signify possible Chronic Kidney Disease.   Basic metabolic panel     Status: Abnormal   Collection Time: 10/19/16  2:11 AM  Result Value Ref Range   Sodium 137 135 - 145 mmol/L   Potassium 3.2 (L) 3.5 - 5.1 mmol/L   Chloride 104 101 - 111 mmol/L   CO2 25 22 - 32 mmol/L   Glucose, Bld 94 65 - 99  mg/dL   BUN 9 6 - 20 mg/dL   Creatinine, Ser 0.87 0.61 - 1.24 mg/dL   Calcium 8.5 (L) 8.9 - 10.3 mg/dL   GFR calc non Af Amer >60 >60 mL/min   GFR calc Af Amer >60 >60 mL/min    Comment: (NOTE) The eGFR has been calculated using the CKD EPI equation. This calculation has not been validated in all clinical situations. eGFR's persistently <60 mL/min signify possible Chronic Kidney Disease.    Anion gap 8 5 - 15    ECG   NSR at 78 - Personally Reviewed  Telemetry   Sinus rhythm - Personally Reviewed  Radiology    Dg Chest 2 View  Result Date: 10/17/2016 CLINICAL DATA:  Right-sided chest pain EXAM: CHEST  2 VIEW COMPARISON:  10/28/2015 FINDINGS: Low lung volumes. No focal consolidation or pleural effusion. Stable mild cardiomegaly. No pneumothorax. Degenerative changes of the spine. IMPRESSION: Low lung volumes without infiltrate or edema.  Mild cardiomegaly. Electronically Signed   By: Donavan Foil M.D.   On: 10/17/2016 16:38    Cardiac Studies    90% stenosis in a small second diagonal branch.  Widely patent LAD and first diagonal.  Normal circumflex artery.  Widely patent/normal codominant right coronary.  Normal left ventricular systolic function with EF 65%.  RECOMMENDATIONS:   Major arteries are widely patent. The small second  diagonal that has significant obstruction but is too small to intervene upon.  Aggressive risk factor modification, including blood pressure control and lipid lowering.  Assessment   Principal Problem:   NSTEMI (non-ST elevated myocardial infarction) [facility]) Active Problems:   Chest pain   Hypertension   Dyspnea   Tremor   Parkinson's disease ([place])   Impaired fasting glucose   Plan   1. NSTEMI with small vessel branch disease. BP poorly controlled yesterday, now better. LDL 83 - goal is <70. On Lipitor 80 mg QHS. Continue aspirin, carvedilol and amlodipine. Sinemet added yesterday for new diagnosis of Parkinson's. Will likely  d/c home today if BP remains controlled after am meds. Follow-up with APP in 7-10 days and with me eventually at the [facility] office. High concern for OSA - would recommend arranging an outpatient sleep study.   Time Spent Directly with Patient:  15 minutes  Length of Stay:  LOS: 1 day   Pixie Casino, MD, [place]  Attending Cardiologist  Direct Dial: [phone]  Fax: [phone]  Website:  [email or web address] Reeb 10/19/2016, 8:40 AM

## 2016-10-19 NOTE — Clinical Social Work Note (Signed)
CSW was consulted. Pt's friend reports he does not have running water. CSW provided [place] Pt was accepting and appreciative. Pt is d/c today. Clinical Social Worker will sign off for now as social work intervention is no longer needed. Please consult us again if new need arises.   Clarisse GougeBridget A Jahniya Duzan 10/19/2016

## 2016-10-19 NOTE — Progress Notes (Signed)
CARDIAC REHAB PHASE I   PRE:  Rate/Rhythm: 93 SR  BP:  Sitting: 161/101        SaO2: 98 RA  MODE:  Ambulation: 300 ft   POST:  Rate/Rhythm: 97 SR  BP:  Sitting: 144/92         SaO2: 100 RA  Pt ambulated 300 ft on RA, handheld assist, fairly steady, shuffled, gait, tolerated fairly well.  Pt c/o "feeling lightheaded," denies any other complaints, declined rest stop. Completed MI education.  Reviewed risk factors, MI book, anti-platelet therapy, activity restrictions, ntg, exercise, heart healthy diet and phase 2 cardiac rehab. Pt verbalized understanding, very overwhelmed. Difficult situation as pt is illiterate, lives alone, eats fried/fast food every day, and has no insurance. Case manager following for discharge needs. Pt agrees to phase 2 cardiac rehab referral, [facility] Pt to recliner after walk, call bell within reach.  [id number] Joylene GrapesEmily C Thresa Dozier, RN, BSN 10/19/2016 10:08 AM

## 2016-10-19 NOTE — Care Management Note (Addendum)
Case Management Note  Patient Details  Name: Jason DeutscherWilliam Roberson MRN: [id number] Date of Birth: 07-05-1955  Subjective/Objective:    Pt admitted with NSTEMI                Action/Plan:   PTA independent from home alone.  Pt confirmed he does not have active medical insurance nor active prescription coverage - pt would like for CM to [facility] or [facility].  CM explained that one appt is made he can than [facility].     Expected Discharge Date:  10/19/16               Expected Discharge Plan:  Home/Self Care  In-House Referral:     Discharge planning Services  CM Consult, [facility], [facility]  Post Acute Care Choice:    Choice offered to:     DME Arranged:    DME Agency:     HH Arranged:  RN, Disease Management, Social Work [employer] Agency:  [facility]  Status of Service:     If discussed at [employer] Meetings, dates discussed:    Additional Comments: CM informed by family friend that pt does not have running water in the home - CSW immediately consulted via phone.  Both bedside nurse and cardiac rehab report that pt has a slow but steady gait - no DME nor in house therapy eval recommended.   Pt states he can not afford discharge medications and will not be [facility] - his ride will only take him to [facility] - CM provided MATCH and explained in detail to pt.  [facility] and disease management in addtion to education with health eating/lifestyle,  HHSW.  Pt is in agreement with services and agency  Jason Roberson appt made for 10/26/16 at 10:30am - CM provided hardcopy of [facility] information, [facility] and documented appt on AVS.   Cherylann ParrClaxton, Jason Roberson S, RN 10/19/2016, 2:36 PM

## 2016-10-20 NOTE — Telephone Encounter (Signed)
Patient contacted states that he is still sleeping and would like to be called back at a later time

## 2016-10-20 NOTE — Telephone Encounter (Signed)
[facility] on 10/17/2016 - 10/19/2016 (2 days).  Patient understands to follow up with provider B Strader on 10-31-16 [facility]. Patient understands discharge instructions? YES  Patient understands medications and regiment? YES Patient understands to bring all medications to this visit? YES

## 2016-10-21 ENCOUNTER — Encounter: Payer: Self-pay | Admitting: Neurology

## 2016-10-26 ENCOUNTER — Ambulatory Visit (INDEPENDENT_AMBULATORY_CARE_PROVIDER_SITE_OTHER): Payer: Self-pay | Admitting: Family Medicine

## 2016-10-26 ENCOUNTER — Ambulatory Visit (HOSPITAL_COMMUNITY)
Admission: RE | Admit: 2016-10-26 | Discharge: 2016-10-26 | Disposition: A | Discharge status: Home / Self Care | Payer: Self-pay | Source: Ambulatory Visit | Attending: Family Medicine | Admitting: Family Medicine

## 2016-10-26 ENCOUNTER — Encounter: Payer: Self-pay | Admitting: Family Medicine

## 2016-10-26 VITALS — BP 146/91 | HR 86 | Temp 98.0°F | Resp 12 | Ht 68.0 in | Wt 268.4 lb

## 2016-10-26 DIAGNOSIS — R05 Cough: Secondary | ICD-10-CM | POA: Insufficient documentation

## 2016-10-26 DIAGNOSIS — I517 Cardiomegaly: Secondary | ICD-10-CM | POA: Insufficient documentation

## 2016-10-26 DIAGNOSIS — Q2546 Tortuous aortic arch: Secondary | ICD-10-CM | POA: Insufficient documentation

## 2016-10-26 DIAGNOSIS — I10 Essential (primary) hypertension: Secondary | ICD-10-CM

## 2016-10-26 DIAGNOSIS — I214 Non-ST elevation (NSTEMI) myocardial infarction: Secondary | ICD-10-CM

## 2016-10-26 DIAGNOSIS — R059 Cough, unspecified: Secondary | ICD-10-CM

## 2016-10-26 DIAGNOSIS — R251 Tremor, unspecified: Secondary | ICD-10-CM

## 2016-10-26 LAB — COMPLETE METABOLIC PANEL WITH GFR
ALBUMIN: 3.8 g/dL (ref 3.6–5.1)
ALK PHOS: 77 U/L (ref 40–115)
ALT: 6 U/L — AB (ref 9–46)
AST: 13 U/L (ref 10–35)
BUN: 11 mg/dL (ref 7–25)
CO2: 25 mmol/L (ref 20–31)
CREATININE: 1.13 mg/dL (ref 0.70–1.33)
Calcium: 9.2 mg/dL (ref 8.6–10.3)
Chloride: 102 mmol/L (ref 98–110)
GFR, EST AFRICAN AMERICAN: 82 mL/min (ref >=60)
GFR, EST NON AFRICAN AMERICAN: 71 mL/min (ref >=60)
GLUCOSE: 102 mg/dL — AB (ref 65–99)
Potassium: 3.2 mmol/L — ABNORMAL LOW (ref 3.5–5.3)
SODIUM: 139 mmol/L (ref 135–146)
TOTAL PROTEIN: 7.1 g/dL (ref 6.1–8.1)
Total Bilirubin: 0.5 mg/dL (ref 0.2–1.2)

## 2016-10-26 LAB — POCT URINALYSIS DIP (DEVICE)
Bilirubin Urine: NEGATIVE
Glucose, UA: NEGATIVE mg/dL
HGB URINE DIPSTICK: NEGATIVE
Ketones, ur: NEGATIVE mg/dL
LEUKOCYTES UA: NEGATIVE
Nitrite: NEGATIVE
Protein, ur: NEGATIVE mg/dL
SPECIFIC GRAVITY, URINE: 1.015 (ref 1.005–1.030)
UROBILINOGEN UA: 2 mg/dL — AB (ref 0.0–1.0)
pH: 6.5 (ref 5.0–8.0)

## 2016-10-26 MED ORDER — CETIRIZINE HCL 10 MG PO TABS: 10.0000 mg | ORAL_TABLET | Freq: Every day | ORAL | 11 refills | Status: DC

## 2016-10-26 MED ORDER — ATORVASTATIN CALCIUM 80 MG PO TABS: 80.0000 mg | ORAL_TABLET | Freq: Every day | ORAL | 6 refills | Status: AC

## 2016-10-26 MED ORDER — HYDROCODONE-HOMATROPINE 5-1.5 MG/5ML PO SYRP: 5.0000 mL | ORAL_SOLUTION | Freq: Three times a day (TID) | ORAL | 0 refills | Status: AC | PRN

## 2016-10-26 MED FILL — HYDROCODONE-HOMATROPINE SYR: 5-1.5 | 8 days supply | Qty: 120 | Fill #0

## 2016-10-26 MED FILL — AMLODIPINE BESYLATE 10 MG T: 10 | 30 days supply | Qty: 30 | Fill #0

## 2016-10-26 NOTE — Patient Instructions (Addendum)
Go over to [facility] Radiology Department to obtain your chest x-ray today.   -For cough take Hycodan Syrup 5 ml every 8 hours as needed for cough  -Take Cetrizine 10 mg at bedtime for congestion  -Keep all follow-up appointments that are currently scheduled.  In the event of chest pain, dizziness, headache, or shortness of breath, please go immediately to the emergency department.    Cough, Adult A cough helps to clear your throat and lungs. A cough may last only 2-3 weeks (acute), or it may last longer than 8 weeks (chronic). Many different things can cause a cough. A cough may be a sign of an illness or another medical condition. Follow these instructions at home:  Pay attention to any changes in your cough.  Take medicines only as told by your doctor.  If you were prescribed an antibiotic medicine, take it as told by your doctor. Do not stop taking it even if you start to feel better.  Talk with your doctor before you try using a cough medicine.  Drink enough fluid to keep your pee (urine) clear or pale yellow.  If the air is dry, use a cold steam vaporizer or humidifier in your home.  Stay away from things that make you cough at work or at home.  If your cough is worse at night, try using extra pillows to raise your head up higher while you sleep.  Do not smoke, and try not to be around smoke. If you need help quitting, ask your doctor.  Do not have caffeine.  Do not drink alcohol.  Rest as needed. Contact a doctor if:  You have new problems (symptoms).  You cough up yellow fluid (pus).  Your cough does not get better after 2-3 weeks, or your cough gets worse.  Medicine does not help your cough and you are not sleeping well.  You have pain that gets worse or pain that is not helped with medicine.  You have a fever.  You are losing weight and you do not know why.  You have night sweats. Get help right away if:  You cough up blood.  You have trouble  breathing.  Your heartbeat is very fast. This information is not intended to replace advice given to you by your health care provider. Make sure you discuss any questions you have with your health care provider. Document Released: 01/27/2011 Document Revised: 10/22/2015 Document Reviewed: 07/23/2014 Elsevier Interactive Patient Education  2017 Elsevier Inc.  Acute Coronary Syndrome Acute coronary syndrome (ACS) is a serious problem in which there is suddenly not enough blood and oxygen supplied to the heart. ACS may mean that one or more of the blood vessels in your heart (coronary arteries) may be blocked. ACS can result in chest pain or a heart attack (myocardial infarction or MI). What are the causes? This condition is caused by atherosclerosis, which is the buildup of fat and cholesterol (plaque) on the inside of the arteries. Over time, the plaque may narrow or block the artery, and this will lessen blood flow to the heart. Plaque can also become weak and break off within a coronary artery to form a clot and cause a sudden blockage. What increases the risk? The risk factors of this condition include:  High cholesterol levels.  High blood pressure (hypertension).  Smoking.  Diabetes.  Age.  Family history of chest pain, heart disease, or stroke.  Lack of exercise. What are the signs or symptoms? The most common signs  of this condition include:  Chest pain, which can be:  A crushing or squeezing in the chest.  A tightness, pressure, fullness, or heaviness in the chest.  Present for more than a few minutes, or it can stop and recur.  Pain in the arms, neck, jaw, or back.  Unexplained heartburn or indigestion.  Shortness of breath.  Nausea.  Sudden cold sweats.  Feeling light-headed or dizzy. Sometimes, this condition has no symptoms. How is this diagnosed? ACS may be diagnosed through the following tests:  Electrocardiogram (ECG).  Blood tests.  Coronary  angiogram. This is a procedure to look at the coronary arteries to see if there is any blockage. How is this treated? Treatment for ACS may include:  Healthy behavioral changes to reduce or control risk factors.  Medicine.  Coronary stenting.A stent helps to keep an artery open.  Coronary angioplasty. This procedure widens a narrowed or blocked artery.  Coronary artery bypass surgery. This will allow your blood to pass the blockage (bypass) to reach your heart. Follow these instructions at home: Eating and drinking   Follow a heart-healthy diet. A dietitian can you help to educate you about healthy food options and changes.  Use healthy cooking methods such as roasting, grilling, broiling, baking, poaching, steaming, or stir-frying. Talk to a dietitian to learn more about healthy cooking methods. Medicines   Take medicines only as directed by your health care provider.  Do not take the following medicines unless your health care provider approves:  Nonsteroidal anti-inflammatory drugs (NSAIDs), such as ibuprofen, naproxen, or celecoxib.  Vitamin supplements that contain vitamin A, vitamin E, or both.  Hormone replacement therapy that contains estrogen with or without progestin.  Stop illegal drug use. Activity   Follow an exercise program that is approved by your health care provider.  Plan rest periods when you are fatigued. Lifestyle   Do not use any tobacco products, including cigarettes, chewing tobacco, or electronic cigarettes. If you need help quitting, ask your health care provider.  If you drink alcohol, and your health care provider approves, limit your alcohol intake to no more than 1 drink per day. One drink equals 12 ounces of beer, 5 ounces of wine, or 1 ounces of hard liquor.  Learn to manage stress.  Maintain a healthy weight. Lose weight as approved by your health care provider. General instructions   Manage other health conditions, such as  hypertension and diabetes, as directed by your health care provider.  Keep all follow-up visits as directed by your health care provider. This is important.  Your health care provider may ask you to monitor your blood pressure. A blood pressure reading consists of a higher number over a lower number, such as 110 over 72, written as 110/72. Ideally, your blood pressure should be:  Below 140/90 if you have no other medical conditions.  Below 130/80 if you have diabetes or kidney disease. Get help right away if:  You have pain in your chest, neck, arm, jaw, stomach, or back that lasts more than a few minutes, is recurring, or is not relieved by taking medicine under your tongue (sublingual nitroglycerin).  You have profuse sweating without cause.  You have unexplained:  Heartburn or indigestion.  Shortness of breath or difficulty breathing.  Nausea or vomiting.  Fatigue.  Feelings of nervousness or anxiety.  Weakness.  Diarrhea.  You have sudden light-headedness or dizziness.  You faint. These symptoms may represent a serious problem that is an emergency. Do not wait  to see if the symptoms will go away. Get medical help right away. Call your local emergency services (911 in the [place] Do not drive yourself to the clinic or hospital. This information is not intended to replace advice given to you by your health care provider. Make sure you discuss any questions you have with your health care provider. Document Released: 05/16/2005 Document Revised: 10/28/2015 Document Reviewed: 09/17/2013 Elsevier Interactive Patient Education  2017 [employer]

## 2016-10-26 NOTE — Progress Notes (Signed)
Patient ID: Jason Roberson, male    DOB: Jun 14, 1955, 61 y.o.   MRN: [id number]  PCP: Bing NeighborsHarris, Jaskirat Zertuche S, FNP  Chief Complaint  Patient presents with  . Establish Care  . Hospitalization Follow-up    Subjective:  HPI  Jason Roberson is a 61 y.o. male presents for a hospital follow-up.  Medical Problems: Hypertension, CAD, Diastolic Dysfunction, Dypsnea  NSTEMI Jason Roberson was admitted to [facility] 10/17/2016 with a complaint of persistent chest pain and shortness of breath. He attempted rest and conservation measures prior to driving himself to the hospital for further evaluation. He had positive serial troponins, CATH revealed a blocked diagonal branch artery that was too small for intervention, mitral regurgitation, with preserved EF 60-65%. Jason Roberson had a long history hypertension that had been untreated for a 4 month period of time as he could not afford medication. Medication intervention management was prescribed including  ASA, Atorvastatin, Carvedilol, and Amlodipine.   Today Jason Roberson reports being absent of chest pain and shortness of breath. He complains of a worrisome cough, which has been present even before his chest pain. Reports that his cough is persistent and occasionally productive of thick clear sputum. No history of smoking. Denies any associates chest tenderness with cough, back pain, palpitations, or fever. No known history of allergies. He experiences some fatigue although continues to walk daily.  During hospitalization, [name] was noted to have a symmetric left greater than right resting tremor. Neurology was consulted and he was started on Sinemet, and advised to follow-up with neurology outpatient for evaluation of possible Parkinson's Disease.    Social History   Social History  . Marital status: Single    Spouse name: N/A  . Number of children: N/A  . Years of education: N/A   Occupational History  . Not on file.   Social History Main Topics  . Smoking  status: Never Smoker  . Smokeless tobacco: Never Used  . Alcohol use No  . Drug use: No  . Sexual activity: Not on file   Other Topics Concern  . Not on file   Social History Narrative  . No narrative on file   Review of Systems  See HPI  Patient Active Problem List   Diagnosis Date Noted  . Parkinson's disease (HCC) 10/19/2016  . Impaired fasting glucose 10/19/2016  . NSTEMI (non-ST elevated myocardial infarction) (HCC) 10/18/2016  . Chest pain 10/18/2016  . Hypertension 10/18/2016  . Dyspnea 10/18/2016  . Tremor   . Epistaxis 09/26/2013  . Syncope 09/26/2013    No Known Allergies  Prior to Admission medications   Medication Sig Start Date End Date Taking? Authorizing Provider  amLODipine (NORVASC) 10 MG tablet Take 1 tablet (10 mg total) by mouth daily. 10/19/16  Yes Bhagat, Bhavinkumar, PA  aspirin 81 MG chewable tablet Chew 1 tablet (81 mg total) by mouth daily. 10/20/16  Yes Bhagat, Bhavinkumar, PA  atorvastatin (LIPITOR) 80 MG tablet Take 1 tablet (80 mg total) by mouth daily at 6 PM. 10/19/16  Yes Bhagat, Bhavinkumar, PA  carbidopa-levodopa (SINEMET IR) 25-100 MG tablet Take 1 tablet by mouth 3 (three) times daily. Need PCP or neurology appointment who will continue the medications 10/19/16  Yes Bhagat, Bhavinkumar, PA  carvedilol (COREG) 6.25 MG tablet Take 1 tablet (6.25 mg total) by mouth 2 (two) times daily with a meal. 10/19/16  Yes Bhagat, Bhavinkumar, PA  nitroGLYCERIN (NITROSTAT) 0.4 MG SL tablet Place 1 tablet (0.4 mg total) under the tongue every 5 (five) minutes x  3 doses as needed for chest pain. 10/19/16  Yes Manson Passey, PA    Past Medical, Surgical Family and Social History reviewed and updated.    Objective:   Today's Vitals   10/26/16 1058  BP: (!) 146/91  Pulse: 86  Resp: 12  Temp: 98 F (36.7 C)  TempSrc: Oral  SpO2: 95%  Weight: 268 lb 6.4 oz (121.7 kg)  Height: 5\' 8"  (1.727 m)    Wt Readings from Last 3 Encounters:  10/26/16 268  lb 6.4 oz (121.7 kg)  10/18/16 269 lb 3.2 oz (122.1 kg)  06/30/14 300 lb (136.1 kg)    Physical Exam  Constitutional: He is oriented to person, place, and time. He appears well-developed and well-nourished.  HENT:  Head: Normocephalic and atraumatic.  Eyes: Conjunctivae and EOM are normal. Pupils are equal, round, and reactive to light.  Neck: Normal range of motion. Neck supple.  Cardiovascular: Normal rate, regular rhythm, normal heart sounds and intact distal pulses.   Pulmonary/Chest: Effort normal and breath sounds normal. He exhibits no tenderness.  Abdominal: Soft. Bowel sounds are normal. He exhibits no distension. There is no tenderness. There is no rebound and no guarding.  Musculoskeletal: Normal range of motion.  Neurological: He is alert and oriented to person, place, and time.  Skin: Skin is warm and dry.  Psychiatric: He has a normal mood and affect. His behavior is normal. Judgment and thought content normal.   Dg Chest 2 View  Result Date: 10/26/2016 CLINICAL DATA:  61 year old male with productive cough for 1 month, progressed recent days. EXAM: CHEST  2 VIEW COMPARISON:  10/17/2016 and earlier. FINDINGS: Stable mild cardiomegaly and tortuous thoracic aorta. Other mediastinal contours are within normal limits. Visualized tracheal air column is within normal limits. Stable lung volumes with increased AP dimension to the chest. No pneumothorax, pulmonary edema, pleural effusion or confluent pulmonary opacity. Negative visible bowel gas pattern. No acute osseous abnormality identified. IMPRESSION: 1.  No acute cardiopulmonary abnormality. 2. Mild cardiomegaly and chronic tortuosity of the thoracic aorta. Electronically Signed   By: Odessa Fleming M.D.   On: 10/26/2016 12:44   Assessment & Plan:  1. NSTEMI (non-ST elevated myocardial infarction) (HCC) -Keep follow-up cardiology  -Continue ASA, carvedilol and nitroglycerin (PRN)  2. Essential hypertension -Amlodipine 10 mg once  daily   3. Tremor -Continue follow-up with neurology -Continue Sinemet as prescribed   4. Cough -Take hydrocodone-homatropine (Hycodan) 5-1.5 mg, 5 ml-every 8 hours as needed for cough  Diagnostic Test Ordered This Encounter -CBC with 3 part Differential - Brain natriuretic peptide -COMPLETE METABOLIC PANEL WITH GFR - Hemoglobin A1c - DG Chest 2 View; Future   RTC: 6 weeks for routine follow-up   Godfrey Pick. Tiburcio Pea, MSN, FNP-C [facility]  [address] [place], [place] [address] [phone]

## 2016-10-27 LAB — BRAIN NATRIURETIC PEPTIDE: BRAIN NATRIURETIC PEPTIDE: 10 pg/mL (ref <100)

## 2016-10-27 LAB — CBC WITH 3 PART DIFFERENTIAL
BASOS ABS: 65 {cells}/uL (ref 0–200)
Basophils Relative: 1 %
Eosinophils Absolute: 325 cells/uL (ref 15–500)
Eosinophils Relative: 5 %
HCT: 38.6 % (ref 38.5–50.0)
Hemoglobin: 13.1 g/dL — ABNORMAL LOW (ref 13.2–17.1)
LYMPHS ABS: 1625 {cells}/uL (ref 850–3900)
Lymphocytes Relative: 25 %
MCH: 30.2 pg (ref 27.0–33.0)
MCHC: 33.9 g/dL (ref 32.0–36.0)
MCV: 88.9 fL (ref 80.0–100.0)
MONOS PCT: 8 %
MPV: 9.7 fL (ref 7.5–12.5)
Monocytes Absolute: 520 cells/uL (ref 200–950)
NEUTROS ABS: 3965 {cells}/uL (ref 1500–7800)
NEUTROS PCT: 61 %
Platelets: 254 10*3/uL (ref 140–400)
RBC: 4.34 MIL/uL (ref 4.20–5.80)
RDW: 14 % (ref 11.0–15.0)
WBC: 6.5 10*3/uL (ref 3.8–10.8)

## 2016-10-27 LAB — HEMOGLOBIN A1C
Hgb A1c MFr Bld: 5.5 % (ref <5.7)
Mean Plasma Glucose: 111 mg/dL

## 2016-10-30 NOTE — Progress Notes (Signed)
Cardiology Office Note    Date:  10/31/2016   ID:  Jason Roberson, DOB Sep 17, 1955, MRN [id number]  PCP:  Jason Neighbors, FNP  Cardiologist: Dr. Rennis Golden   Chief Complaint  Patient presents with  . Hospitalization Follow-up    History of Present Illness:    Jason Roberson is a 61 y.o. male with past medical history of HTN and Parkinson's Disease who presents to the office today for hospital follow-up.   He was recently admitted from 5/21 - 10/19/2016 for evaluation of chest pain which occurred while walking up a flight of stairs. His initial EKG showed no acute ischemic changes but troponin was elevated to 0.32, therefore he was admitted for further observation. His troponin values peaked at 0.35 and his echocardiogram showed a preserved EF of 60-65%, no WMA, with moderate MR. A cardiac catheterization was performed, showing 90% stenosis along a small 2nd Diag branch with patent LAD, 1st Diag, LCx, and RCA. The 2nd Diag was too small for intervention, therefore risk factor modification was recommended. During admission, he was noted to have a resting tremor which required Neurology evaluation. It was thought this was secondary to idiopathic Parkinson's disease, therefore he was started on Sinemet and close follow-up with Neurology was arranged.   In talking with the patient today, he denies any repeat episodes of chest pain since his recent hospitalization. He has been trying to increase his activity and is walking up to 2 blocks per day and denies any exertional symptoms with this. No orthopnea, PND, lower extremity edema, or palpitations. Does report occasional dizziness.  He reports good compliance with his medication regimen. He was started on Carbidopa-Levodopa during his recent admission and says this has significantly improved his resting tremor. Has also noticed improvement in his gait. Still having low back pain which has been present for 3+ years. Not exacerbated by the recent addition  of statin therapy.   He has made significant improvements in his diet. He was consuming fried chicken or fried pork chops on a daily basis but has switched to baked chicken. Weight has declined over 3 pounds since his recent hospitalization.   Past Medical History:  Diagnosis Date  . CAD (coronary artery disease)    a. cath 10/18/16 - 95% 2nd Diag but vessel was too small for intervension  . Hypertension   . Nosebleed   . Parkinson disease [facility])     Past Surgical History:  Procedure Laterality Date  . LEFT HEART CATH AND CORONARY ANGIOGRAPHY N/A 10/18/2016   Procedure: Left Heart Cath and Coronary Angiography;  Surgeon: Lyn Records, MD;  Location: [facility] INVASIVE CV LAB;  Service: Cardiovascular;  Laterality: N/A;    Current Medications: Outpatient Medications Prior to Visit  Medication Sig Dispense Refill  . amLODipine (NORVASC) 10 MG tablet Take 1 tablet (10 mg total) by mouth daily. 30 tablet 6  . aspirin 81 MG chewable tablet Chew 1 tablet (81 mg total) by mouth daily. 30 tablet 6  . atorvastatin (LIPITOR) 80 MG tablet Take 1 tablet (80 mg total) by mouth daily at 6 PM. 30 tablet 6  . carbidopa-levodopa (SINEMET IR) 25-100 MG tablet Take 1 tablet by mouth 3 (three) times daily. Need PCP or neurology appointment who will continue the medications 90 tablet 3  . carvedilol (COREG) 6.25 MG tablet Take 1 tablet (6.25 mg total) by mouth 2 (two) times daily with a meal. 60 tablet 6  . cetirizine (ZYRTEC) 10 MG tablet Take 1 tablet (10  mg total) by mouth daily. 30 tablet 11  . HYDROcodone-homatropine (HYCODAN) 5-1.5 MG/5ML syrup Take 5 mLs by mouth every 8 (eight) hours as needed for cough. 120 mL 0  . nitroGLYCERIN (NITROSTAT) 0.4 MG SL tablet Place 1 tablet (0.4 mg total) under the tongue every 5 (five) minutes x 3 doses as needed for chest pain. 25 tablet 12   No facility-administered medications prior to visit.      Allergies:   Patient has no known allergies.   Social History    Social History  . Marital status: Single    Spouse name: N/A  . Number of children: N/A  . Years of education: N/A   Social History Main Topics  . Smoking status: Never Smoker  . Smokeless tobacco: Never Used  . Alcohol use No  . Drug use: No  . Sexual activity: Not Asked   Other Topics Concern  . None   Social History Narrative  . None     Family History:  The patient's family history includes CAD in his father.   Review of Systems:   Please see the history of present illness.     General:  No chills, fever, night sweats or weight changes.  Cardiovascular:  No chest pain, dyspnea on exertion, edema, orthopnea, palpitations, paroxysmal nocturnal dyspnea. Dermatological: No rash, lesions/masses Respiratory: No cough, dyspnea Urologic: No hematuria, dysuria Abdominal:   No nausea, vomiting, diarrhea, bright red blood per rectum, melena, or hematemesis MSK: Positive for back pain.  Neurologic:  No visual changes, wkns, changes in mental status. All other systems reviewed and are otherwise negative except as noted above.   Physical Exam:    VS:  BP 128/84   Pulse 88   Ht 5\' 8"  (1.727 m)   Wt 266 lb (120.7 kg)   BMI 40.45 kg/m    General: Well developed, overweight African American male appearing in no acute distress. Head: Normocephalic, atraumatic, sclera non-icteric, no xanthomas, nares are without discharge.  Neck: No carotid bruits. JVD not elevated.  Lungs: Respirations regular and unlabored, without wheezes or rales.  Heart: Regular rate and rhythm. No S3 or S4.  No rubs or gallops appreciated. 2/6 SEM along Apex.  Abdomen: Soft, non-tender, non-distended with normoactive bowel sounds. No hepatomegaly. No rebound/guarding. No obvious abdominal masses. Msk:  Strength and tone appear normal for age. No joint deformities or effusions. Extremities: No clubbing or cyanosis. No lower extremity edema.  Distal pedal pulses are 2+ bilaterally. Right radial cath site  with no ecchymosis or evidence of a hematoma.  Neuro: Alert and oriented X 3. Moves all extremities spontaneously. No focal deficits noted. Psych:  Responds to questions appropriately with a normal affect. Skin: No rashes or lesions noted  Wt Readings from Last 3 Encounters:  10/31/16 266 lb (120.7 kg)  10/26/16 268 lb 6.4 oz (121.7 kg)  10/18/16 269 lb 3.2 oz (122.1 kg)     Studies/Labs Reviewed:   EKG:  EKG is not ordered today.   Recent Labs: 10/26/2016: ALT 6; Brain Natriuretic Peptide 10.0; BUN 11; Creat 1.13; Hemoglobin 13.1; Platelets 254; Potassium 3.2; Sodium 139   Lipid Panel    Component Value Date/Time   CHOL 133 10/17/2016 2333   TRIG 40 10/17/2016 2333   HDL 42 10/17/2016 2333   CHOLHDL 3.2 10/17/2016 2333   VLDL 8 10/17/2016 2333   LDLCALC 83 10/17/2016 2333    Additional studies/ records that were reviewed today include:   Echocardiogram: 10/18/2016  - Left  ventricle: The cavity size was normal. There was mild   concentric hypertrophy. Systolic function was normal. The   estimated ejection fraction was in the range of 60% to 65%. Wall   motion was normal; there were no regional wall motion   abnormalities. Doppler parameters are consistent with abnormal   left ventricular relaxation (grade 1 diastolic dysfunction). - Aortic valve: Valve area (VTI): 5.64 cm^2. Valve area (Vmax):   4.53 cm^2. Valve area (Vmean): 4.63 cm^2. - Mitral valve: There was mild to moderate regurgitation directed   posteriorly. - Left atrium: The atrium was moderately dilated.   Cardiac Catheterization: 10/18/2016   90% stenosis in a small second diagonal branch.  Widely patent LAD and first diagonal.  Normal circumflex artery.  Widely patent/normal codominant right coronary.  Normal left ventricular systolic function with EF 65%.  RECOMMENDATIONS:   Major arteries are widely patent. The small second diagonal that has significant obstruction but is too small to  intervene upon.  Aggressive risk factor modification, including blood pressure control and lipid lowering.  Assessment:    No diagnosis found.   Plan:   In order of problems listed above:  1. CAD/ Subsequent Episode of Care for NSTEMI - recently admitted from 5/21 - 10/19/2016 for an NSTEMI with peak troponin values of 0.35. Cath showed an isolated 90% stenosis along a small 2nd Diag branch with patent LAD, 1st Diag, LCx, and RCA. The 2nd Diag was too small for intervention, therefore risk factor modification was recommended.  - he denies any repeat episodes of chest pain or dyspnea on exertion. Has been walking up to 2 blocks per day and denies any exertional symptoms with this. He has made significant dietary changes and is walking through his neighborhood daily.  - continue ASA, statin, and BB therapy.   2. HTN - BP is well-controlled at 128/84 at today's visit. - continue Amlodipine 10mg  daily and Coreg 6.25mg  BID.   3. HLD - Lipid Panel during recent admission showed total cholesterol of 133, HDL 42, and LDL 83. Goal LDL is < 70 with known CAD. - continue Atorvastatin 80mg  daily. Will need repeat FLP and LFT's in 6 weeks.  4. Moderate MR - noted on echo during recent admission.  - continue to follow.   5. Parkinson's Disease - started on Sinemet during his recent admission.  - has Neurology follow-up scheduled for later this month.   6. Low Back Pain - Has been occurring for the past 3 years. He denies any acute worsening of his symptoms. - Recommended he try taking OTC Tylenol for the pain and follow-up with his PCP in regards to further evaluation.    Medication Adjustments/Labs and Tests Ordered: Current medicines are reviewed at length with the patient today.  Concerns regarding medicines are outlined above.  Medication changes, Labs and Tests ordered today are listed in the Patient Instructions below. Patient Instructions  Medication Instructions:  Your physician  recommends that you continue on your current medications as directed. Please refer to the Current Medication list given to you today.  If you need a refill on your cardiac medications before your next appointment, please call your pharmacy.   Follow-Up: Your physician wants you to follow-up: PLEASE KEEP FOLLOW UP APPOINTMENT AS SCHEDULED WITH DR HILTY 12-20-2016 @ 9am.  Special Instructions: MAY TAKE TYLENOL (ACETAMINOPHEN) AS-NEEDED FOR YOUR BACK PAIN  Thank you for choosing [facility] at [employer], Ellsworth Lennox, PA-C  10/31/2016 1:03 PM  [place], [place], [place]  [address] Phone: [phone]; Fax: [phone]  [address] [place], [place] [address] Phone: [phone]

## 2016-10-31 ENCOUNTER — Ambulatory Visit (INDEPENDENT_AMBULATORY_CARE_PROVIDER_SITE_OTHER): Payer: Self-pay | Admitting: Student

## 2016-10-31 ENCOUNTER — Encounter: Payer: Self-pay | Admitting: Student

## 2016-10-31 VITALS — BP 128/84 | HR 88 | Ht 68.0 in | Wt 266.0 lb

## 2016-10-31 DIAGNOSIS — M545 Low back pain: Secondary | ICD-10-CM

## 2016-10-31 DIAGNOSIS — I2510 Atherosclerotic heart disease of native coronary artery without angina pectoris: Secondary | ICD-10-CM | POA: Insufficient documentation

## 2016-10-31 DIAGNOSIS — I340 Nonrheumatic mitral (valve) insufficiency: Secondary | ICD-10-CM

## 2016-10-31 DIAGNOSIS — G8929 Other chronic pain: Secondary | ICD-10-CM

## 2016-10-31 DIAGNOSIS — G20 Parkinson's disease: Secondary | ICD-10-CM

## 2016-10-31 DIAGNOSIS — I214 Non-ST elevation (NSTEMI) myocardial infarction: Secondary | ICD-10-CM

## 2016-10-31 DIAGNOSIS — I10 Essential (primary) hypertension: Secondary | ICD-10-CM

## 2016-10-31 DIAGNOSIS — E785 Hyperlipidemia, unspecified: Secondary | ICD-10-CM | POA: Insufficient documentation

## 2016-10-31 NOTE — Patient Instructions (Signed)
Medication Instructions:  Your physician recommends that you continue on your current medications as directed. Please refer to the Current Medication list given to you today.  If you need a refill on your cardiac medications before your next appointment, please call your pharmacy.   Follow-Up: Your physician wants you to follow-up: PLEASE KEEP FOLLOW UP APPOINTMENT AS SCHEDULED WITH DR HILTY 12-20-2016 @ 9am.  Special Instructions: MAY TAKE TYLENOL (ACETAMINOPHEN) AS-NEEDED FOR YOUR BACK PAIN  Thank you for choosing [facility] at [facility]!!

## 2016-11-07 NOTE — Progress Notes (Signed)
Jason DeutscherWilliam Roberson was seen today in the movement disorders clinic for neurologic consultation at the request of Dr. Amada JupiterKirkpatrick.  The consultation is for the evaluation of parkinsonism.  The records that were made available to me were reviewed.  Pt was hospitalized on 10/18/16 for CP (now resolved) and while in the hospital it was noted that patient had tremor and shuffling gait.  Tremor has been going on for 4-6 months.  Tremor started in the R hand but now involves the L hand as well (hospital records indicate the opposite).  Pt is right hand dominant.  Carbidopa/levodopa 25/100 three times per day was started on 10/18/16 while in the hospital.  Pt states that the medication helped the tremor and helped the walking some.    Specific Symptoms:  Tremor: Yes.  , right and left Family hx of similar:  No. Voice: "voice is dragging" Sleep: sleepy during day but trouble sleeping at night  Vivid Dreams:  No.  Acting out dreams:  No. Wet Pillows: Yes.   Postural symptoms:  No.  Falls?  No. Bradykinesia symptoms: shuffling gait and difficulty getting out of a chair Loss of smell:  No. Loss of taste:  No. Urinary Incontinence:  No. Difficulty Swallowing:  No. Handwriting, micrographia: No. Trouble with ADL's:  No.  Trouble buttoning clothing: No. Depression:  No. Memory changes:  No. Hallucinations:  No.  visual distortions: No. N/V:  no Lightheaded:  yes  Syncope: No. Diplopia:  No. Dyskinesia:  No.  Neuroimaging of the brain has not previously been performed.    PREVIOUS MEDICATIONS: Sinemet  ALLERGIES:  No Known Allergies  CURRENT MEDICATIONS:  Outpatient Encounter Prescriptions as of 11/09/2016  Medication Sig  . amLODipine (NORVASC) 10 MG tablet Take 1 tablet (10 mg total) by mouth daily.  Marland Kitchen. aspirin 81 MG chewable tablet Chew 1 tablet (81 mg total) by mouth daily.  Marland Kitchen. atorvastatin (LIPITOR) 80 MG tablet Take 1 tablet (80 mg total) by mouth daily at 6 PM.  . carbidopa-levodopa  (SINEMET IR) 25-100 MG tablet Take 1 tablet by mouth 3 (three) times daily. Need PCP or neurology appointment who will continue the medications  . carvedilol (COREG) 6.25 MG tablet Take 1 tablet (6.25 mg total) by mouth 2 (two) times daily with a meal.  . HYDROcodone-homatropine (HYCODAN) 5-1.5 MG/5ML syrup Take 5 mLs by mouth every 8 (eight) hours as needed for cough.  . nitroGLYCERIN (NITROSTAT) 0.4 MG SL tablet Place 1 tablet (0.4 mg total) under the tongue every 5 (five) minutes x 3 doses as needed for chest pain. (Patient not taking: Reported on 11/09/2016)  . [DISCONTINUED] cetirizine (ZYRTEC) 10 MG tablet Take 1 tablet (10 mg total) by mouth daily.   No facility-administered encounter medications on file as of 11/09/2016.     PAST MEDICAL HISTORY:   Past Medical History:  Diagnosis Date  . CAD (coronary artery disease)    a. cath 10/18/16 - 95% 2nd Diag but vessel was too small for intervension  . Hypertension   . Nosebleed   . Parkinson disease (HCC)     PAST SURGICAL HISTORY:   Past Surgical History:  Procedure Laterality Date  . LEFT HEART CATH AND CORONARY ANGIOGRAPHY N/A 10/18/2016   Procedure: Left Heart Cath and Coronary Angiography;  Surgeon: Lyn RecordsSmith, Henry W, MD;  Location: [facility] INVASIVE CV LAB;  Service: Cardiovascular;  Laterality: N/A;    SOCIAL HISTORY:   Social History   Social History  . Marital status: Single  Spouse name: N/A  . Number of children: N/A  . Years of education: N/A   Occupational History  . retired     DJ   Social History Main Topics  . Smoking status: Never Smoker  . Smokeless tobacco: Never Used  . Alcohol use No  . Drug use: No  . Sexual activity: Not on file   Other Topics Concern  . Not on file   Social History Narrative  . No narrative on file    FAMILY HISTORY:   Family Status  Relation Status  . Father Deceased  . Mother Deceased  . Sister Deceased  . Son Alive  . Daughter Alive    ROS:  A complete 10 system review  of systems was obtained and was unremarkable apart from what is mentioned above.  PHYSICAL EXAMINATION:    VITALS:   Vitals:   11/09/16 0925  BP: 118/84  Pulse: 88  SpO2: 96%  Weight: 260 lb (117.9 kg)  Height: 5\' 8"  (1.727 m)    GEN:  The patient appears stated age and is in NAD.  He is dissheveled in appearance. HEENT:  Normocephalic, atraumatic.  The mucous membranes are moist. The superficial temporal arteries are without ropiness or tenderness. CV:  RRR Lungs:  CTAB Neck/HEME:  There are no carotid bruits bilaterally.  There is a mass on the L neck (solid, immobile).  Neurological examination:  Orientation: The patient is alert and oriented x3. Fund of knowledge is appropriate.  Recent and remote memory are intact.  Attention and concentration are normal.    Able to name objects and repeat phrases. Cranial nerves: There is good facial symmetry. There is facial hypomimia.  Pupils are equal round and reactive to light bilaterally. Fundoscopic exam is attempted but the disc margins are not well visualized bilaterally. Extraocular muscles are intact. The visual fields are full to confrontational testing. The speech is fluent and clear but he is hypophonic  . Soft palate rises symmetrically and there is no tongue deviation. Hearing is intact to conversational tone. Sensation: Sensation is intact to light and pinprick throughout (facial, trunk, extremities). Vibration is intact at the bilateral big toe. There is no extinction with double simultaneous stimulation. There is no sensory dermatomal level identified. Motor: Strength is 5/5 in the bilateral upper and lower extremities.   Shoulder shrug is equal and symmetric.  There is no pronator drift. Deep tendon reflexes: Deep tendon reflexes are 2-/4 at the bilateral biceps, triceps, brachioradialis, absent at the bilateral patella and achilles. Plantar responses are downgoing bilaterally.  Movement examination: Tone: There is normal tone  in the bilateral upper extremities.  The tone in the lower extremities is normal.  Abnormal movements: There is an independent R and LUE resting tremro Coordination:  There is  decremation with RAM's, seen with hand opening and closing, finger taps and alternation of supination/pronation on the L.  All other RAM's on the L and R are normal.    Gait and Station: The patient has no difficulty arising out of a deep-seated chair without the use of the hands. The patient's stride length is normal with decreased arm swing on the L.  The patient has a negative pull test.    LABS    Chemistry      Component Value Date/Time   NA 139 10/26/2016 1141   K 3.2 (L) 10/26/2016 1141   CL 102 10/26/2016 1141   CO2 25 10/26/2016 1141   BUN 11 10/26/2016 1141  CREATININE 1.13 10/26/2016 1141      Component Value Date/Time   CALCIUM 9.2 10/26/2016 1141   ALKPHOS 77 10/26/2016 1141   AST 13 10/26/2016 1141   ALT 6 (L) 10/26/2016 1141   BILITOT 0.5 10/26/2016 1141     No results found for: TSH  No results found for: VITAMINB12    Lab Results  Component Value Date   WBC 6.5 10/26/2016   HGB 13.1 (L) 10/26/2016   HCT 38.6 10/26/2016   MCV 88.9 10/26/2016   PLT 254 10/26/2016     ASSESSMENT/PLAN:  1.  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    -We discussed the diagnosis as well as pathophysiology of the disease.  We discussed treatment options as well as prognostic indicators.  Patient education was provided.  -We discussed that it used to be thought that levodopa would increase risk of melanoma but now it is believed that Parkinsons itself likely increases risk of melanoma. he is to get regular skin checks.  -Greater than 50% of the 80 minute visit was spent in counseling answering questions and talking about what to expect now as well as in the future.  We talked about  medication options as well as potential future surgical options.  We talked about safety in the home.  -Continue carbidopa/levodopa 25/100, one tablet 3 times per day.  We talked about the potential interaction with protein.    -We discussed community resources in the area including patient support groups and community exercise programs for PD and pt education was provided to the patient.  -we will do TSH and B12  2.  L neck mass  -patient reports present x 2 years but also reports that hasn't told anyone.  Wanted to do testing with u/s but self pay (MCD pending still).  Will send to endocrine for eval.  Cannot go to ENT given self pay status.  Needs PCP and discussed this with him today.  3.  F/u with me in next few months, sooner should new issues arise.    Cc:  Bing Neighbors, FNP

## 2016-11-09 ENCOUNTER — Other Ambulatory Visit: Payer: Self-pay

## 2016-11-09 ENCOUNTER — Encounter: Payer: Self-pay | Admitting: Neurology

## 2016-11-09 ENCOUNTER — Ambulatory Visit (INDEPENDENT_AMBULATORY_CARE_PROVIDER_SITE_OTHER): Payer: Self-pay | Admitting: Neurology

## 2016-11-09 VITALS — BP 118/84 | HR 88 | Ht 68.0 in | Wt 260.0 lb

## 2016-11-09 DIAGNOSIS — R5382 Chronic fatigue, unspecified: Secondary | ICD-10-CM

## 2016-11-09 DIAGNOSIS — D649 Anemia, unspecified: Secondary | ICD-10-CM

## 2016-11-09 DIAGNOSIS — G20 Parkinson's disease: Secondary | ICD-10-CM

## 2016-11-09 DIAGNOSIS — R251 Tremor, unspecified: Secondary | ICD-10-CM

## 2016-11-09 DIAGNOSIS — R221 Localized swelling, mass and lump, neck: Secondary | ICD-10-CM

## 2016-11-09 DIAGNOSIS — G20A1 Parkinson's disease without dyskinesia, without mention of fluctuations: Secondary | ICD-10-CM

## 2016-11-09 LAB — TSH: TSH: 0.21 m[IU]/L — AB (ref 0.40–4.50)

## 2016-11-09 NOTE — Patient Instructions (Addendum)
1. Your provider has requested that you have labwork completed today. Please go to [facility] ([id number]) on the second floor of this building before leaving the office today. You do not need to check in. If you are not called within 15 minutes please check with the front desk.   2. We have sent a referral to [facility] to evaluate your neck mass and establish care.   3. Continue Carbidopa Levodopa 25/100 - 1 tablet three times daily.   4. Follow up 3 months

## 2016-11-10 ENCOUNTER — Telehealth: Payer: Self-pay | Admitting: Neurology

## 2016-11-10 LAB — VITAMIN B12: VITAMIN B 12: 264 pg/mL (ref 200–1100)

## 2016-11-10 NOTE — Telephone Encounter (Signed)
-----   Message from Octaviano Battyebecca S Tat, DO sent at 11/10/2016  8:44 AM EDT ----- Let pt know that TSH is abnormal but we did just send referral to endocrine and they should be able to make recommendations.  Also would like to see B12 above 400.  Take 1000mcg of otc B12

## 2016-11-10 NOTE — Telephone Encounter (Signed)
Patient made aware.

## 2016-11-22 ENCOUNTER — Telehealth: Payer: Self-pay | Admitting: Endocrinology

## 2016-11-22 NOTE — Telephone Encounter (Signed)
LM for pt to call back to schedule °

## 2016-11-22 NOTE — Telephone Encounter (Signed)
-----   Message from Reather LittlerAjay Kumar, MD sent at 11/14/2016  8:17 AM EDT ----- We will make an exception as this is an internal referral Dr. Everardo AllEllison has already accepted and okay with me  Judeth CornfieldStephanie: Please schedule  ----- Message ----- From: Vladimir Fasterat, Rebecca S, DO Sent: 11/14/2016   7:38 AM To: Reather LittlerAjay Kumar, MD  Hoping you could help me out or give me direction with this new to me patient.  He has very poor health literacy and probably hasn't seen a doctor in a long time.  Was just hospitalized for CP and then sent to me for possible PD, which he has.  When I saw him I noted a L neck mass, that the patient said has been there for 2 years, but hasn't been noted by any other physician and he hasn't told anyone about it.  His TSH was 0.21.  He has no PCP.  He is self pay, MCD pending.  I tried to send him to you for eval but your office said you don't accept MCD (which he actually doesn't even have yet as far as I know).  Anyway, thoughts on how I can get this man some care as no one wants this neurologist attempting to help him with these complex issues out of my field!!  Thank you!  Lurena Joinerebecca Tat

## 2016-11-28 ENCOUNTER — Ambulatory Visit: Payer: Self-pay | Admitting: Family Medicine

## 2016-12-20 ENCOUNTER — Encounter: Payer: Self-pay | Admitting: *Deleted

## 2016-12-20 ENCOUNTER — Ambulatory Visit: Payer: Self-pay | Admitting: Internal Medicine

## 2017-01-24 ENCOUNTER — Encounter: Payer: Self-pay | Admitting: Neurology

## 2017-02-08 ENCOUNTER — Encounter: Payer: Self-pay | Admitting: Neurology

## 2017-02-15 ENCOUNTER — Telehealth: Payer: Self-pay | Admitting: Neurology

## 2017-02-15 NOTE — Telephone Encounter (Signed)
Sent certified letter to contact our office for follow up and endocrinology appt. Certified letter was returned to our office.

## 2017-02-23 NOTE — Progress Notes (Deleted)
Jason Roberson was seen today in the movement disorders clinic for neurologic consultation at the request of Dr. Amada Jupiter.  The consultation is for the evaluation of parkinsonism.  The records that were made available to me were reviewed.  Pt was hospitalized on 10/18/16 for CP (now resolved) and while in the hospital it was noted that patient had tremor and shuffling gait.  Tremor has been going on for 4-6 months.  Tremor started in the R hand but now involves the L hand as well (hospital records indicate the opposite).  Pt is right hand dominant.  Carbidopa/levodopa 25/100 three times per day was started on 10/18/16 while in the hospital.  Pt states that the medication helped the tremor and helped the walking some.    Specific Symptoms:  Tremor: Yes.  , right and left Family hx of similar:  No. Voice: "voice is dragging" Sleep: sleepy during day but trouble sleeping at night  Vivid Dreams:  No.  Acting out dreams:  No. Wet Pillows: Yes.   Postural symptoms:  No.  Falls?  No. Bradykinesia symptoms: shuffling gait and difficulty getting out of a chair Loss of smell:  No. Loss of taste:  No. Urinary Incontinence:  No. Difficulty Swallowing:  No. Handwriting, micrographia: No. Trouble with ADL's:  No.  Trouble buttoning clothing: No. Depression:  No. Memory changes:  No. Hallucinations:  No.  visual distortions: No. N/V:  no Lightheaded:  yes  Syncope: No. Diplopia:  No. Dyskinesia:  No.  Neuroimaging of the brain has not previously been performed.    02/27/17 update: Patient seen in follow-up.  He is on carbidopa/levodopa 25/100, one tablet 3 times per day.  I had referred him to endocrinology for neck mass.  They were unable to get a hold of him.  We called and sent a certified letter about that, which was ultimately returned to [place] unopened.   PREVIOUS MEDICATIONS: Sinemet  ALLERGIES:  No Known Allergies  CURRENT MEDICATIONS:  Outpatient Encounter Prescriptions as of  02/27/2017  Medication Sig  . amLODipine (NORVASC) 10 MG tablet Take 1 tablet (10 mg total) by mouth daily.  Marland Kitchen aspirin 81 MG chewable tablet Chew 1 tablet (81 mg total) by mouth daily.  Marland Kitchen atorvastatin (LIPITOR) 80 MG tablet Take 1 tablet (80 mg total) by mouth daily at 6 PM.  . carbidopa-levodopa (SINEMET IR) 25-100 MG tablet Take 1 tablet by mouth 3 (three) times daily. Need PCP or neurology appointment who will continue the medications  . carvedilol (COREG) 6.25 MG tablet Take 1 tablet (6.25 mg total) by mouth 2 (two) times daily with a meal.  . HYDROcodone-homatropine (HYCODAN) 5-1.5 MG/5ML syrup Take 5 mLs by mouth every 8 (eight) hours as needed for cough.  . nitroGLYCERIN (NITROSTAT) 0.4 MG SL tablet Place 1 tablet (0.4 mg total) under the tongue every 5 (five) minutes x 3 doses as needed for chest pain. (Patient not taking: Reported on 11/09/2016)   No facility-administered encounter medications on file as of 02/27/2017.     PAST MEDICAL HISTORY:   Past Medical History:  Diagnosis Date  . CAD (coronary artery disease)    a. cath 10/18/16 - 95% 2nd Diag but vessel was too small for intervension  . Hypertension   . Nosebleed   . Parkinson disease (HCC)     PAST SURGICAL HISTORY:   Past Surgical History:  Procedure Laterality Date  . LEFT HEART CATH AND CORONARY ANGIOGRAPHY N/A 10/18/2016   Procedure: Left Heart Cath and  Coronary Angiography;  Surgeon: Lyn Records, MD;  Location: [facility] INVASIVE CV LAB;  Service: Cardiovascular;  Laterality: N/A;    SOCIAL HISTORY:   Social History   Social History  . Marital status: Single    Spouse name: N/A  . Number of children: N/A  . Years of education: N/A   Occupational History  . retired     DJ   Social History Main Topics  . Smoking status: Never Smoker  . Smokeless tobacco: Never Used  . Alcohol use No  . Drug use: No  . Sexual activity: Not on file   Other Topics Concern  . Not on file   Social History Narrative  . No  narrative on file    FAMILY HISTORY:   Family Status  Relation Status  . Father Deceased  . Mother Deceased  . Sister Deceased  . Son Alive  . Daughter Alive    ROS:  A complete 10 system review of systems was obtained and was unremarkable apart from what is mentioned above.  PHYSICAL EXAMINATION:    VITALS:   There were no vitals filed for this visit.  GEN:  The patient appears stated age and is in NAD.  He is dissheveled in appearance. HEENT:  Normocephalic, atraumatic.  The mucous membranes are moist. The superficial temporal arteries are without ropiness or tenderness. CV:  RRR Lungs:  CTAB Neck/HEME:  There are no carotid bruits bilaterally.  There is a mass on the L neck (solid, immobile).  Neurological examination:  Orientation: The patient is alert and oriented x3. Fund of knowledge is appropriate.  Recent and remote memory are intact.  Attention and concentration are normal.    Able to name objects and repeat phrases. Cranial nerves: There is good facial symmetry. There is facial hypomimia.  Pupils are equal round and reactive to light bilaterally. Fundoscopic exam is attempted but the disc margins are not well visualized bilaterally. Extraocular muscles are intact. The visual fields are full to confrontational testing. The speech is fluent and clear but he is hypophonic  . Soft palate rises symmetrically and there is no tongue deviation. Hearing is intact to conversational tone. Sensation: Sensation is intact to light and pinprick throughout (facial, trunk, extremities). Vibration is intact at the bilateral big toe. There is no extinction with double simultaneous stimulation. There is no sensory dermatomal level identified. Motor: Strength is 5/5 in the bilateral upper and lower extremities.   Shoulder shrug is equal and symmetric.  There is no pronator drift. Deep tendon reflexes: Deep tendon reflexes are 2-/4 at the bilateral biceps, triceps, brachioradialis, absent at the  bilateral patella and achilles. Plantar responses are downgoing bilaterally.  Movement examination: Tone: There is normal tone in the bilateral upper extremities.  The tone in the lower extremities is normal.  Abnormal movements: There is an independent R and LUE resting tremro Coordination:  There is  decremation with RAM's, seen with hand opening and closing, finger taps and alternation of supination/pronation on the L.  All other RAM's on the L and R are normal.    Gait and Station: The patient has no difficulty arising out of a deep-seated chair without the use of the hands. The patient's stride length is normal with decreased arm swing on the L.  The patient has a negative pull test.    LABS    Chemistry      Component Value Date/Time   NA 139 10/26/2016 1141   K 3.2 (L) 10/26/2016  1141   CL 102 10/26/2016 1141   CO2 25 10/26/2016 1141   BUN 11 10/26/2016 1141   CREATININE 1.13 10/26/2016 1141      Component Value Date/Time   CALCIUM 9.2 10/26/2016 1141   ALKPHOS 77 10/26/2016 1141   AST 13 10/26/2016 1141   ALT 6 (L) 10/26/2016 1141   BILITOT 0.5 10/26/2016 1141     Lab Results  Component Value Date   TSH 0.21 (L) 11/09/2016    Lab Results  Component Value Date   VITAMINB12 264 11/09/2016      Lab Results  Component Value Date   WBC 6.5 10/26/2016   HGB 13.1 (L) 10/26/2016   HCT 38.6 10/26/2016   MCV 88.9 10/26/2016   PLT 254 10/26/2016     ASSESSMENT/PLAN:  1.  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    -We discussed the diagnosis as well as pathophysiology of the disease.  We discussed treatment options as well as prognostic indicators.  Patient education was provided.  -We discussed that it used to be thought that levodopa would increase risk of melanoma but now it is believed that Parkinsons itself likely increases risk of melanoma. he  is to get regular skin checks.  -Greater than 50% of the 80 minute visit was spent in counseling answering questions and talking about what to expect now as well as in the future.  We talked about medication options as well as potential future surgical options.  We talked about safety in the home.  -Continue carbidopa/levodopa 25/100, one tablet 3 times per day.  We talked about the potential interaction with protein.    -We discussed community resources in the area including patient support groups and community exercise programs for PD and pt education was provided to the patient.  -we will do TSH and B12  2.  L neck mass  -patient reports present x 2 years but also reports that hasn't told anyone.  Wanted to do testing with u/s but self pay (MCD pending still).  Will send to endocrine for eval.  Cannot go to ENT given self pay status.  Needs PCP and discussed this with him today.  3.  F/u with me in next few months, sooner should new issues arise.    Cc:  Bing Neighbors, FNP

## 2017-02-27 ENCOUNTER — Ambulatory Visit: Payer: Self-pay | Admitting: Neurology

## 2017-02-27 DIAGNOSIS — Z029 Encounter for administrative examinations, unspecified: Secondary | ICD-10-CM

## 2017-03-03 ENCOUNTER — Ambulatory Visit: Payer: Self-pay | Admitting: Neurology

## 2017-04-30 ENCOUNTER — Emergency Department (HOSPITAL_COMMUNITY)
Admission: EM | Admit: 2017-04-30 | Discharge: 2017-04-30 | Disposition: A | Discharge status: Home / Self Care | Payer: Self-pay | Attending: Emergency Medicine | Admitting: Emergency Medicine

## 2017-04-30 DIAGNOSIS — I10 Essential (primary) hypertension: Secondary | ICD-10-CM | POA: Insufficient documentation

## 2017-04-30 DIAGNOSIS — Z79899 Other long term (current) drug therapy: Secondary | ICD-10-CM | POA: Insufficient documentation

## 2017-04-30 DIAGNOSIS — R040 Epistaxis: Secondary | ICD-10-CM | POA: Insufficient documentation

## 2017-04-30 DIAGNOSIS — I252 Old myocardial infarction: Secondary | ICD-10-CM | POA: Insufficient documentation

## 2017-04-30 DIAGNOSIS — I2510 Atherosclerotic heart disease of native coronary artery without angina pectoris: Secondary | ICD-10-CM | POA: Insufficient documentation

## 2017-04-30 DIAGNOSIS — Z7982 Long term (current) use of aspirin: Secondary | ICD-10-CM | POA: Insufficient documentation

## 2017-04-30 LAB — PROTIME-INR
INR: 1.08
Prothrombin Time: 13.9 seconds (ref 11.4–15.2)

## 2017-04-30 LAB — CBC WITH DIFFERENTIAL/PLATELET
BASOS ABS: 0 10*3/uL (ref 0.0–0.1)
Basophils Relative: 0 %
Eosinophils Absolute: 0.2 10*3/uL (ref 0.0–0.7)
Eosinophils Relative: 3 %
HEMATOCRIT: 35.3 % — AB (ref 39.0–52.0)
Hemoglobin: 11.6 g/dL — ABNORMAL LOW (ref 13.0–17.0)
LYMPHS ABS: 1.6 10*3/uL (ref 0.7–4.0)
LYMPHS PCT: 26 %
MCH: 28.6 pg (ref 26.0–34.0)
MCHC: 32.9 g/dL (ref 30.0–36.0)
MCV: 87.2 fL (ref 78.0–100.0)
MONO ABS: 0.5 10*3/uL (ref 0.1–1.0)
Monocytes Relative: 8 %
NEUTROS ABS: 3.8 10*3/uL (ref 1.7–7.7)
Neutrophils Relative %: 63 %
Platelets: 269 10*3/uL (ref 150–400)
RBC: 4.05 MIL/uL — AB (ref 4.22–5.81)
RDW: 13.7 % (ref 11.5–15.5)
WBC: 6.1 10*3/uL (ref 4.0–10.5)

## 2017-04-30 LAB — BASIC METABOLIC PANEL
Anion gap: 7 (ref 5–15)
BUN: 7 mg/dL (ref 6–20)
CO2: 25 mmol/L (ref 22–32)
Calcium: 8.6 mg/dL — ABNORMAL LOW (ref 8.9–10.3)
Chloride: 105 mmol/L (ref 101–111)
Creatinine, Ser: 1.01 mg/dL (ref 0.61–1.24)
GFR calc Af Amer: >60 mL/min (ref >60)
GFR calc non Af Amer: >60 mL/min (ref >60)
GLUCOSE: 111 mg/dL — AB (ref 65–99)
POTASSIUM: 3.3 mmol/L — AB (ref 3.5–5.1)
Sodium: 137 mmol/L (ref 135–145)

## 2017-04-30 MED ORDER — TRANEXAMIC ACID 1000 MG/10ML IV SOLN
500.0000 mg | Freq: Once | INTRAVENOUS | Status: AC
Start: 2017-04-30 — End: 2017-04-30
  Administered 2017-04-30: 500 mg via TOPICAL
  Filled 2017-04-30: qty 10

## 2017-04-30 MED ORDER — HYDROCODONE-ACETAMINOPHEN 5-325 MG PO TABS
1.0000 | ORAL_TABLET | Freq: Once | ORAL | Status: AC
  Administered 2017-04-30: 1 via ORAL
  Filled 2017-04-30: qty 1

## 2017-04-30 MED ORDER — LIDOCAINE-EPINEPHRINE (PF) 2 %-1:200000 IJ SOLN
20.0000 mL | Freq: Once | INTRAMUSCULAR | Status: AC
  Administered 2017-04-30: 20 mL
  Filled 2017-04-30: qty 20

## 2017-04-30 MED ORDER — CEPHALEXIN 500 MG PO CAPS: 500.0000 mg | ORAL_CAPSULE | Freq: Four times a day (QID) | ORAL | 0 refills | Status: AC

## 2017-04-30 NOTE — Discharge Instructions (Signed)
Take the antibiotics as prescribed.  Follow-up with the ear nose and throat doctor to have this packing removed in 3 days.  Return to the ED if you develop new or worsening symptoms.

## 2017-04-30 NOTE — ED Triage Notes (Signed)
Nose bleed for 3-4 hours

## 2017-04-30 NOTE — ED Provider Notes (Signed)
[facility] EMERGENCY DEPARTMENT Provider Note   CSN: [id number] Arrival date & time: 04/30/17  0353     History   Chief Complaint Chief Complaint  Patient presents with  . Epistaxis    HPI Jason Roberson is a 61 y.o. male.  Patient arrives via EMS with profuse nosebleed from the left side for about the past 3 hours.  Denies trauma.  Has a history of recurrent nosebleeds requiring packing in the past.  Has not followed up with ENT.  He does not take any blood thinners.  Denies any dizziness or lightheadedness.  No chest pain or shortness of breath.  He is on a baby aspirin.   The history is provided by the patient and the EMS personnel.  Epistaxis      Past Medical History:  Diagnosis Date  . CAD (coronary artery disease)    a. cath 10/18/16 - 95% 2nd Diag but vessel was too small for intervension  . Hypertension   . Nosebleed   . Parkinson disease [facility])     Patient Active Problem List   Diagnosis Date Noted  . CAD (coronary artery disease) 10/31/2016  . Hyperlipidemia LDL goal <70 10/31/2016  . Parkinson's disease (HCC) 10/19/2016  . Impaired fasting glucose 10/19/2016  . Non-ST elevation myocardial infarction (NSTEMI), subsequent episode of care (HCC) 10/18/2016  . Chest pain 10/18/2016  . Essential hypertension 10/18/2016  . Dyspnea 10/18/2016  . Tremor   . Epistaxis 09/26/2013  . Syncope 09/26/2013    Past Surgical History:  Procedure Laterality Date  . LEFT HEART CATH AND CORONARY ANGIOGRAPHY N/A 10/18/2016   Procedure: Left Heart Cath and Coronary Angiography;  Surgeon: Lyn Records, MD;  Location: [facility] INVASIVE CV LAB;  Service: Cardiovascular;  Laterality: N/A;       Home Medications    Prior to Admission medications   Medication Sig Start Date End Date Taking? Authorizing Provider  amLODipine (NORVASC) 10 MG tablet Take 1 tablet (10 mg total) by mouth daily. 10/19/16   Manson Passey, PA  aspirin 81 MG chewable tablet Chew 1  tablet (81 mg total) by mouth daily. 10/20/16   Manson Passey, PA  atorvastatin (LIPITOR) 80 MG tablet Take 1 tablet (80 mg total) by mouth daily at 6 PM. 10/26/16   Bing Neighbors, FNP  carbidopa-levodopa (SINEMET IR) 25-100 MG tablet Take 1 tablet by mouth 3 (three) times daily. Need PCP or neurology appointment who will continue the medications 10/19/16   Bhagat, Dewey, PA  carvedilol (COREG) 6.25 MG tablet Take 1 tablet (6.25 mg total) by mouth 2 (two) times daily with a meal. 10/19/16   Bhagat, Bhavinkumar, PA  HYDROcodone-homatropine (HYCODAN) 5-1.5 MG/5ML syrup Take 5 mLs by mouth every 8 (eight) hours as needed for cough. 10/26/16   Bing Neighbors, FNP  nitroGLYCERIN (NITROSTAT) 0.4 MG SL tablet Place 1 tablet (0.4 mg total) under the tongue every 5 (five) minutes x 3 doses as needed for chest pain. Patient not taking: Reported on 11/09/2016 10/19/16   Manson Passey, PA    Family History Family History  Problem Relation Age of Onset  . CAD Father   . Hypertension Father   . Breast cancer Sister     Social History Social History   Tobacco Use  . Smoking status: Never Smoker  . Smokeless tobacco: Never Used  Substance Use Topics  . Alcohol use: No  . Drug use: No     Allergies   Patient has no known  allergies.   Review of Systems Review of Systems  Constitutional: Negative for activity change and appetite change.  HENT: Positive for nosebleeds.   Respiratory: Negative for cough, chest tightness and shortness of breath.   Cardiovascular: Negative for chest pain.  Gastrointestinal: Negative for abdominal pain, nausea and vomiting.  Genitourinary: Negative for dysuria, hematuria, testicular pain and urgency.  Musculoskeletal: Negative for arthralgias and myalgias.  Skin: Negative for rash.  Neurological: Negative for dizziness, weakness and headaches.    all other systems are negative except as noted in the HPI and PMH.    Physical Exam Updated  Vital Signs BP (!) 169/109   Pulse 82   Temp 98.6 F (37 C) (Oral)   Resp 18   SpO2 99%   Physical Exam  Constitutional: He is oriented to person, place, and time. He appears well-developed and well-nourished. No distress.  HENT:  Head: Normocephalic and atraumatic.  Mouth/Throat: Oropharynx is clear and moist. No oropharyngeal exudate.  Active brisk bleeding from L nare after large clot removed. Bleeding in posterior pharynx.  Eyes: Conjunctivae and EOM are normal. Pupils are equal, round, and reactive to light.  Neck: Normal range of motion. Neck supple.  No meningismus.  Cardiovascular: Normal rate, regular rhythm, normal heart sounds and intact distal pulses.  No murmur heard. Pulmonary/Chest: Effort normal and breath sounds normal. No respiratory distress. He exhibits no tenderness.  Abdominal: Soft. There is no tenderness. There is no rebound and no guarding.  Musculoskeletal: Normal range of motion. He exhibits no edema or tenderness.  Neurological: He is alert and oriented to person, place, and time. No cranial nerve deficit. He exhibits normal muscle tone. Coordination normal.  No ataxia on finger to nose bilaterally. No pronator drift. 5/5 strength throughout. CN 2-12 intact.Equal grip strength. Sensation intact.   Skin: Skin is warm.  Psychiatric: He has a normal mood and affect. His behavior is normal.  Nursing note and vitals reviewed.    ED Treatments / Results  Labs (all labs ordered are listed, but only abnormal results are displayed) Labs Reviewed  CBC WITH DIFFERENTIAL/PLATELET - Abnormal; Notable for the following components:      Result Value   RBC 4.05 (*)    Hemoglobin 11.6 (*)    HCT 35.3 (*)    All other components within normal limits  BASIC METABOLIC PANEL - Abnormal; Notable for the following components:   Potassium 3.3 (*)    Glucose, Bld 111 (*)    Calcium 8.6 (*)    All other components within normal limits  PROTIME-INR    EKG  EKG  Interpretation None       Radiology No results found.  Procedures .Epistaxis Management Date/Time: 04/30/2017 4:27 AM Performed by: Glynn Octaveancour, Amran Malter, MD Authorized by: Glynn Octaveancour, Darel Ricketts, MD   Consent:    Consent obtained:  Verbal and emergent situation   Consent given by:  Patient   Risks discussed:  Bleeding and nasal injury Anesthesia (see MAR for exact dosages):    Anesthesia method:  Topical application   Topical anesthetic:  Epinephrine and lidocaine gel Procedure details:    Treatment site:  L anterior   Treatment method:  Anterior pack, silver nitrate, nasal tampon and thrombin   Treatment complexity:  Extensive   Treatment episode: recurring   Post-procedure details:    Assessment:  No improvement   Patient tolerance of procedure:  Tolerated well, no immediate complications    (including critical care time)  Medications Ordered in ED Medications  tranexamic acid (CYKLOKAPRON) injection 500 mg (500 mg Topical Given 04/30/17 0412)  lidocaine-EPINEPHrine (XYLOCAINE W/EPI) 2 %-1:200000 (PF) injection 20 mL (20 mLs Infiltration Given 04/30/17 0412)     Initial Impression / Assessment and Plan / ED Course  I have reviewed the triage vital signs and the nursing notes.  Pertinent labs & imaging results that were available during my care of the patient were reviewed by me and considered in my medical decision making (see chart for details).    Patient presents with profuse bleeding from his left nare.  Large clots were evacuated and nose was cauterized and emergently packed with nasal Rhino Rocket.  Patient remains stable in the ED. Hemoglobin slightly decreased.   Monitored in the ED without recurrence of bleeding. No bleeding in posterior pharynx. Patient to follow up with ENT for packing removal. Prophylactic antibiotics given.  Return precautions discussed.  Final Clinical Impressions(s) / ED Diagnoses   Final diagnoses:  Left-sided epistaxis    ED  Discharge Orders    None       Lilyann Gravelle, Jeannett SeniorStephen, MD 04/30/17 [phone]

## 2017-04-30 NOTE — ED Notes (Signed)
Pt c/o slight headache. Will inform md.

## 2017-04-30 NOTE — ED Notes (Signed)
Pt up to restroom.

## 2017-04-30 NOTE — ED Notes (Addendum)
Md in to see pt. Bleeding stopped. Pt c/o wanting something to eat. Will speak with md.

## 2017-04-30 NOTE — ED Notes (Signed)
MD to bedside.

## 2017-04-30 NOTE — ED Notes (Signed)
Patient denies pain and is resting comfortably.  

## 2017-05-04 ENCOUNTER — Telehealth: Payer: Self-pay

## 2017-05-04 ENCOUNTER — Telehealth: Payer: Self-pay | Admitting: *Deleted

## 2017-05-04 NOTE — Telephone Encounter (Signed)
Pt called related to being referred to ENT who does not accept orange card.  EDCM advised pt to call his PCP office ([facility]) for ENT office that accepts orange card.

## 2017-05-04 NOTE — Telephone Encounter (Signed)
Patient was advised that he will need to fill out a application for [facility] patient assistance or he would have to cash pay. Patient state that he would go back to the ED. I advise patient that he should really apply for the assistance for follow up care.

## 2017-05-05 ENCOUNTER — Other Ambulatory Visit: Payer: Self-pay

## 2017-05-05 ENCOUNTER — Emergency Department (HOSPITAL_COMMUNITY)
Admission: EM | Admit: 2017-05-05 | Discharge: 2017-05-05 | Disposition: A | Discharge status: Home / Self Care | Payer: Self-pay | Attending: Emergency Medicine | Admitting: Emergency Medicine

## 2017-05-05 ENCOUNTER — Encounter (HOSPITAL_COMMUNITY): Payer: Self-pay

## 2017-05-05 DIAGNOSIS — I10 Essential (primary) hypertension: Secondary | ICD-10-CM | POA: Insufficient documentation

## 2017-05-05 DIAGNOSIS — G20 Parkinson's disease: Secondary | ICD-10-CM | POA: Insufficient documentation

## 2017-05-05 DIAGNOSIS — I252 Old myocardial infarction: Secondary | ICD-10-CM | POA: Insufficient documentation

## 2017-05-05 DIAGNOSIS — Z79899 Other long term (current) drug therapy: Secondary | ICD-10-CM | POA: Insufficient documentation

## 2017-05-05 DIAGNOSIS — Z7982 Long term (current) use of aspirin: Secondary | ICD-10-CM | POA: Insufficient documentation

## 2017-05-05 DIAGNOSIS — I2510 Atherosclerotic heart disease of native coronary artery without angina pectoris: Secondary | ICD-10-CM | POA: Insufficient documentation

## 2017-05-05 DIAGNOSIS — R040 Epistaxis: Secondary | ICD-10-CM | POA: Insufficient documentation

## 2017-05-05 DIAGNOSIS — Z4800 Encounter for change or removal of nonsurgical wound dressing: Secondary | ICD-10-CM | POA: Insufficient documentation

## 2017-05-05 MED ORDER — CARBIDOPA-LEVODOPA 25-100 MG PO TABS: 1.0000 | ORAL_TABLET | Freq: Three times a day (TID) | ORAL | 0 refills | Status: AC

## 2017-05-05 MED ORDER — CARBIDOPA-LEVODOPA 25-100 MG PO TABS: 1.0000 | ORAL_TABLET | Freq: Three times a day (TID) | ORAL | 3 refills | Status: DC

## 2017-05-05 NOTE — ED Provider Notes (Signed)
[facility] Provider Note   CSN: [id number] Arrival date & time: 05/05/17  1002     History   Chief Complaint Chief Complaint  Patient presents with  . Epistaxis    HPI Jason Roberson is a 61 y.o. male with a past medical history of CAD, hypertension, who presents to ED for evaluation of removal of Rhino Rocket from left nare that was placed 5 days ago.  He presented to ED initially for nosebleed that was persistent from left nare.  He does have a history of nosebleeds in the past.  He states that he was not able to follow-up with ENT due to financial constraints.  He reports no bleeding since packing was placed.  He denies any lightheadedness, bleeding elsewhere, or any significant discomfort. He would also like a refill of his Parkinson's medication.  HPI  Past Medical History:  Diagnosis Date  . CAD (coronary artery disease)    a. cath 10/18/16 - 95% 2nd Diag but vessel was too small for intervension  . Hypertension   . Nosebleed   . Parkinson disease [facility](HCC)     Patient Active Problem List   Diagnosis Date Noted  . CAD (coronary artery disease) 10/31/2016  . Hyperlipidemia LDL goal <70 10/31/2016  . Parkinson's disease (HCC) 10/19/2016  . Impaired fasting glucose 10/19/2016  . Non-ST elevation myocardial infarction (NSTEMI), subsequent episode of care (HCC) 10/18/2016  . Chest pain 10/18/2016  . Essential hypertension 10/18/2016  . Dyspnea 10/18/2016  . Tremor   . Epistaxis 09/26/2013  . Syncope 09/26/2013    Past Surgical History:  Procedure Laterality Date  . LEFT HEART CATH AND CORONARY ANGIOGRAPHY N/A 10/18/2016   Procedure: Left Heart Cath and Coronary Angiography;  Surgeon: Lyn RecordsSmith, Henry W, MD;  [facility] CV LAB;  Service: Cardiovascular;  Laterality: N/A;       Home Medications    Prior to Admission medications   Medication Sig Start Date End Date Taking? Authorizing Provider  amLODipine (NORVASC) 10 MG  tablet Take 1 tablet (10 mg total) by mouth daily. Patient not taking: Reported on 04/30/2017 10/19/16   Manson PasseyBhagat, Bhavinkumar, PA  aspirin 81 MG chewable tablet Chew 1 tablet (81 mg total) by mouth daily. 10/20/16   Manson PasseyBhagat, Bhavinkumar, PA  atorvastatin (LIPITOR) 80 MG tablet Take 1 tablet (80 mg total) by mouth daily at 6 PM. Patient not taking: Reported on 04/30/2017 10/26/16   Bing NeighborsHarris, Kimberly S, FNP  carbidopa-levodopa (SINEMET IR) 25-100 MG tablet Take 1 tablet by mouth 3 (three) times daily. Need PCP or neurology appointment who will continue the medications 10/19/16   Bhagat, Holly HillBhavinkumar, PA  carvedilol (COREG) 6.25 MG tablet Take 1 tablet (6.25 mg total) by mouth 2 (two) times daily with a meal. 10/19/16   Bhagat, Bhavinkumar, PA  cephALEXin (KEFLEX) 500 MG capsule Take 1 capsule (500 mg total) by mouth 4 (four) times daily. 04/30/17   Rancour, Jeannett SeniorStephen, MD  HYDROcodone-homatropine (HYCODAN) 5-1.5 MG/5ML syrup Take 5 mLs by mouth every 8 (eight) hours as needed for cough. Patient not taking: Reported on 04/30/2017 10/26/16   Bing NeighborsHarris, Kimberly S, FNP  nitroGLYCERIN (NITROSTAT) 0.4 MG SL tablet Place 1 tablet (0.4 mg total) under the tongue every 5 (five) minutes x 3 doses as needed for chest pain. 10/19/16   Manson PasseyBhagat, Bhavinkumar, PA    Family History Family History  Problem Relation Age of Onset  . CAD Father   . Hypertension Father   . Breast cancer Sister  Social History Social History   Tobacco Use  . Smoking status: Never Smoker  . Smokeless tobacco: Never Used  Substance Use Topics  . Alcohol use: No  . Drug use: No     Allergies   Patient has no known allergies.   Review of Systems Review of Systems  Constitutional: Negative for chills and fever.  HENT: Negative for congestion and nosebleeds.   Skin: Negative for rash.  Neurological: Negative for light-headedness.     Physical Exam Updated Vital Signs BP (!) 151/96 (BP Location: Right Arm)   Pulse 95   Temp 98.3 F  (36.8 C) (Oral)   Resp 20   Ht 5\' 8"  (1.727 m)   Wt 104.3 kg (230 lb)   SpO2 100%   BMI 34.97 kg/m   Physical Exam  Constitutional: He appears well-developed and well-nourished. No distress.  HENT:  Head: Normocephalic and atraumatic.  Nose: Nose normal.  Nasal packing in place with dried blood surrounding left nare.  No active bleeding at this time.  Eyes: Conjunctivae and EOM are normal. No scleral icterus.  Neck: Normal range of motion.  Pulmonary/Chest: Effort normal. No respiratory distress.  Neurological: He is alert.  Skin: No rash noted. He is not diaphoretic.  Psychiatric: He has a normal mood and affect.  Nursing note and vitals reviewed.    ED Treatments / Results  Labs (all labs ordered are listed, but only abnormal results are displayed) Labs Reviewed - No data to display  EKG  EKG Interpretation None       Radiology No results found.  Procedures Procedures (including critical care time)  Medications Ordered in ED Medications - No data to display   Initial Impression / Assessment and Plan / ED Course  I have reviewed the triage vital signs and the nursing notes.  Pertinent labs & imaging results that were available during my care of the patient were reviewed by me and considered in my medical decision making (see chart for details).     Patient presents to ED for removal of nasal packing. He originally presented to ED 5 days ago for epistaxis from left nare.  He was told to follow-up with ENT but was unable to due to financial reasons.  He states that bleeding has stopped since placement.  Nasal packing was removed successfully.  Advised patient to follow-up with primary care provider for further evaluation.  He would also like a refill of his Parkinson's medication.  Patient appears stable for discharge at this time. Strict return precautions given.  Final Clinical Impressions(s) / ED Diagnoses   Final diagnoses:  Encounter for removal of nasal  pack    ED Discharge Orders    None        Dietrich PatesKhatri, Abriana Saltos, PA-C 05/05/17 1119    Gerhard MunchLockwood, Robert, MD 05/05/17 1609

## 2017-05-05 NOTE — Discharge Instructions (Signed)
Follow up with your primary care provider for further evaluation. Return to ED for return of bleeding, lightheadedness, loss of consciousness, trouble breathing.

## 2017-05-05 NOTE — ED Notes (Signed)
Sign pad not working verbalized understanding of discharge instructions.  

## 2017-05-05 NOTE — ED Triage Notes (Signed)
Per PT, PT is returning to have nasal trumpet removed from his nose. He was unable to see ENT.

## 2017-05-05 NOTE — ED Notes (Signed)
Patient one touch. See provider for assessment.

## 2017-11-14 ENCOUNTER — Other Ambulatory Visit: Payer: Self-pay | Admitting: Physician Assistant

## 2017-11-14 NOTE — Telephone Encounter (Signed)
Dr. Cristina GongHilton's pt please address.

## 2017-11-14 NOTE — Telephone Encounter (Signed)
Outpatient Medication Detail    Disp Refills Start End   carbidopa-levodopa (SINEMET IR) 25-100 MG tablet 90 tablet 0 05/05/2017    Sig - Route: Take 1 tablet by mouth 3 (three) times daily. Need PCP or neurology appointment who will continue the medications - Oral   Class: Print   [facility]   [facility] [address] [place] [place] - [address]

## 2017-11-17 ENCOUNTER — Other Ambulatory Visit: Payer: Self-pay | Admitting: Physician Assistant

## 2019-05-01 IMAGING — DX DG CHEST 2V
2 series · 2 of 2 positions shown · non-contrast
Comparison: 10/17/2016 and earlier.

CLINICAL DATA: 60-year-old male with productive cough for 1 month,
progressed recent days.

EXAM:
CHEST  2 VIEW

[chest pa]
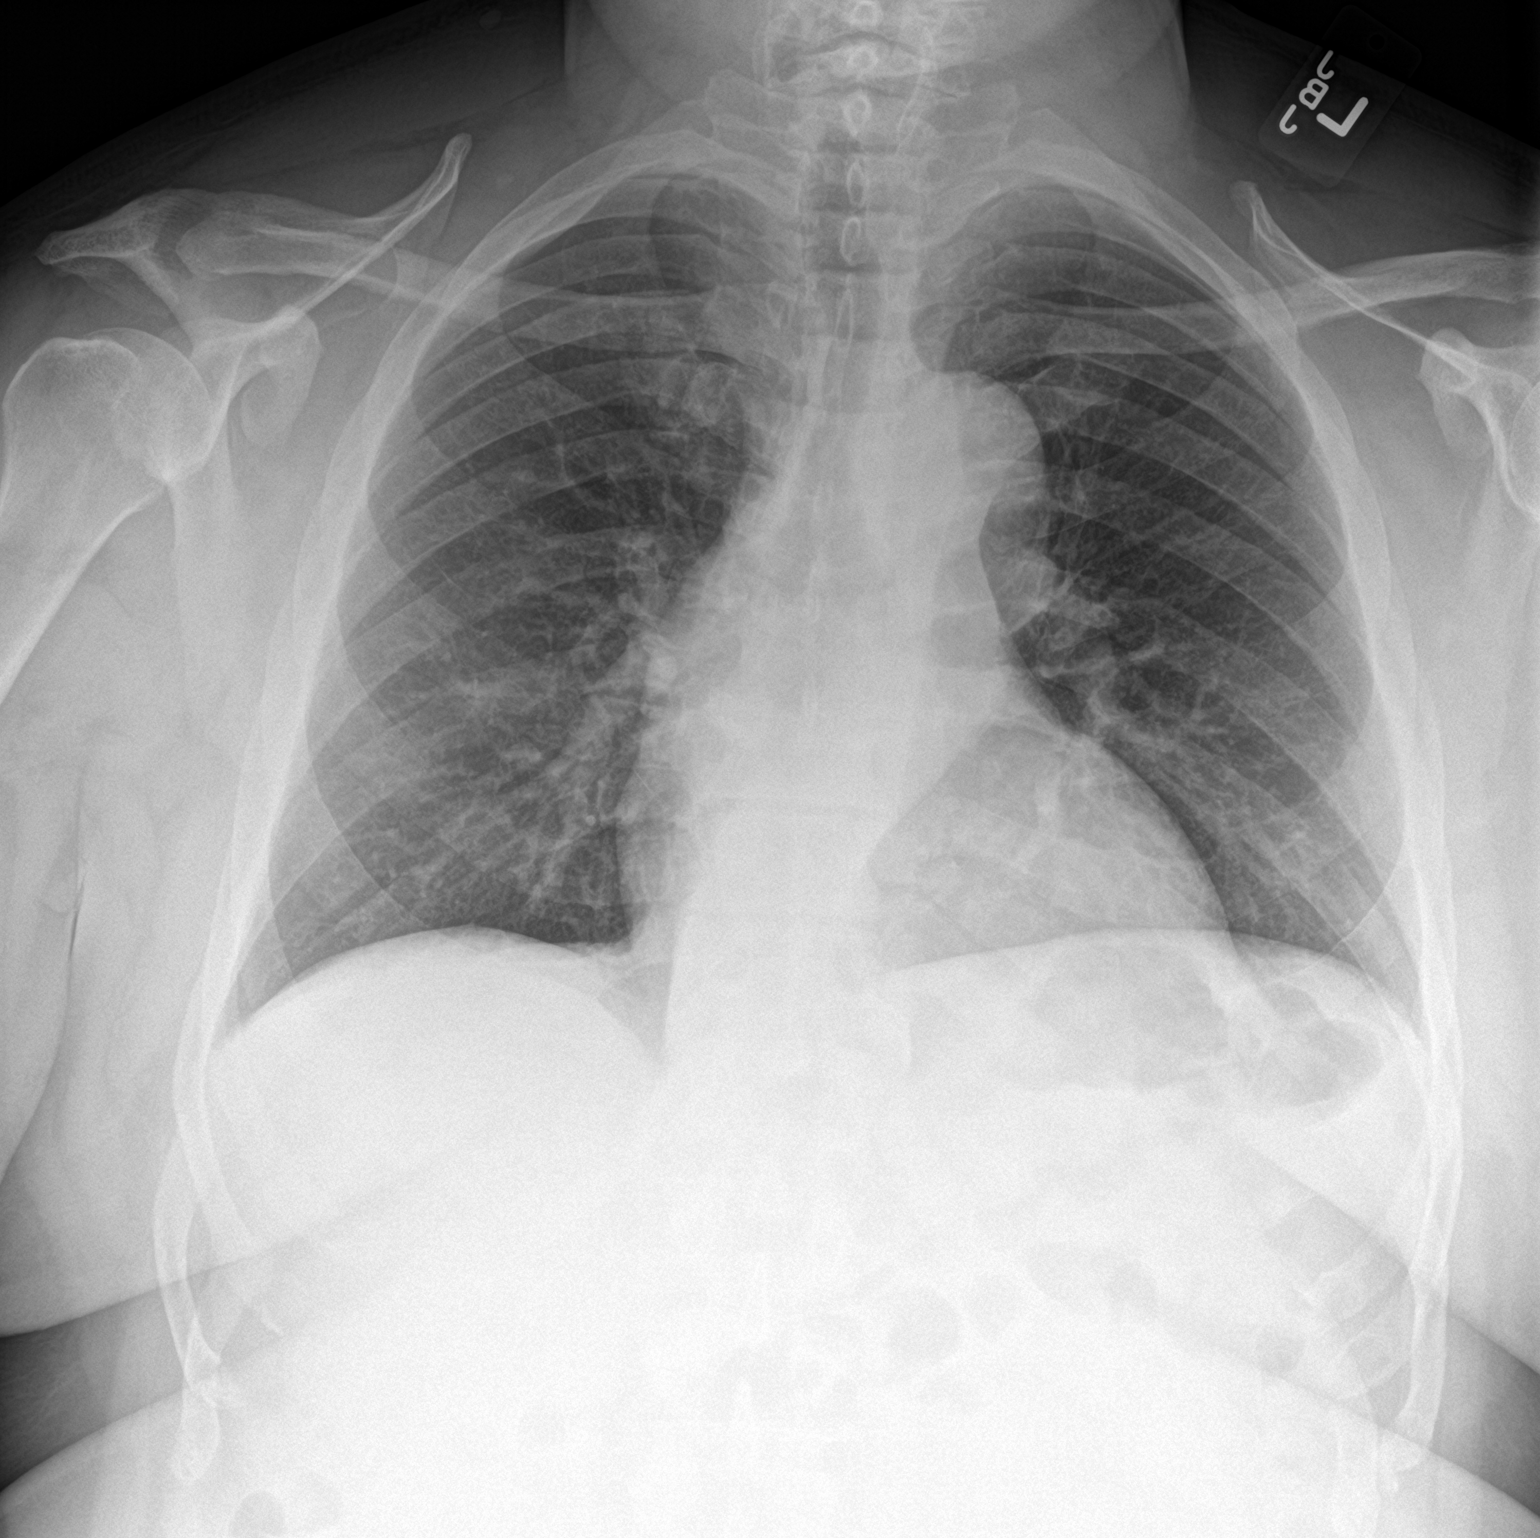

[chest lat]
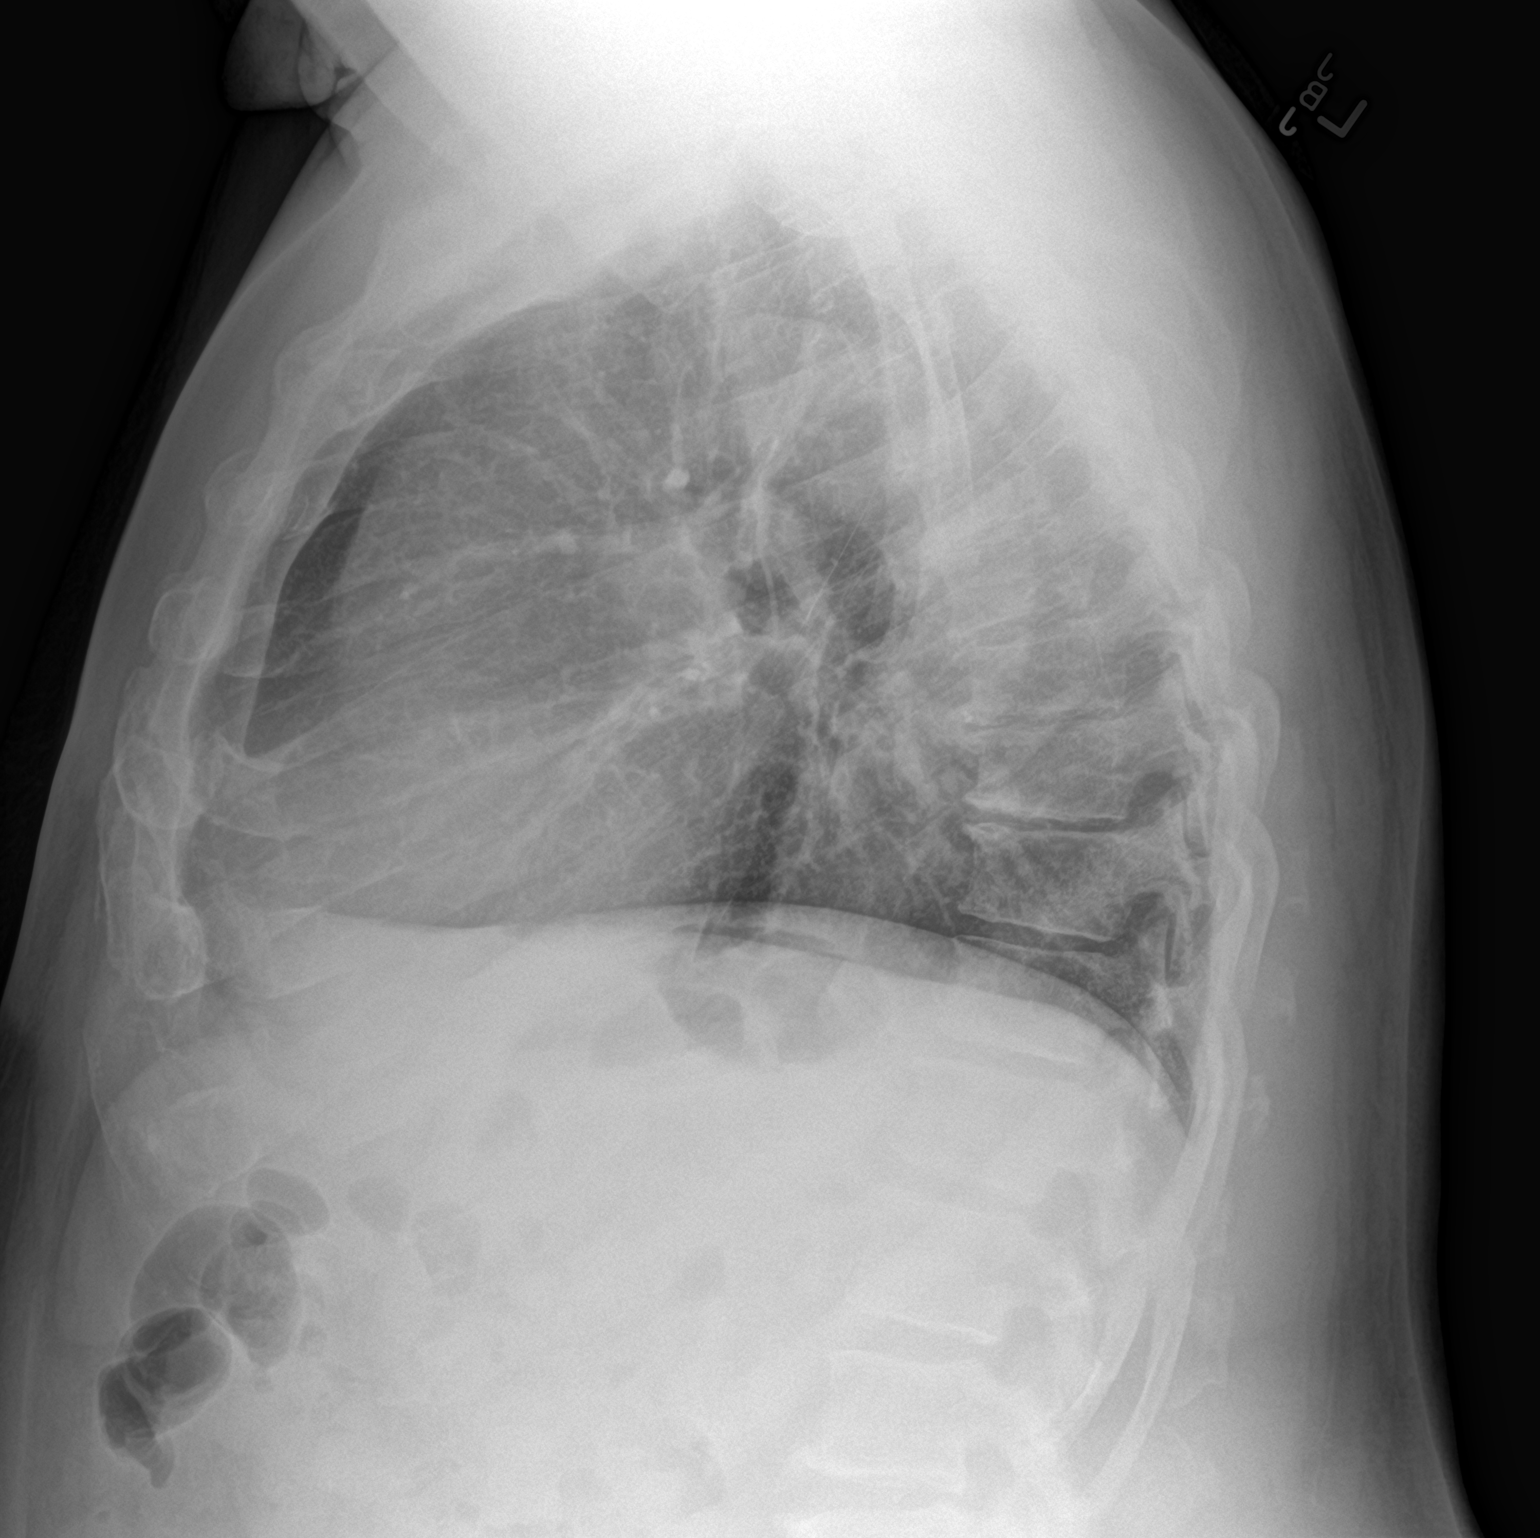

[2 of 2 positions shown; findings below may reference images not displayed]

FINDINGS: Stable mild cardiomegaly and tortuous thoracic aorta. Other
mediastinal contours are within normal limits. Visualized tracheal
air column is within normal limits. Stable lung volumes with
increased AP dimension to the chest. No pneumothorax, pulmonary
edema, pleural effusion or confluent pulmonary opacity. Negative
visible bowel gas pattern. No acute osseous abnormality identified.
IMPRESSION: 1.  No acute cardiopulmonary abnormality.
2. Mild cardiomegaly and chronic tortuosity of the thoracic aorta.
